# Patient Record
Sex: Female | Born: 1940
Health system: Southern US, Community
[De-identification: ages and names within clinical notes are randomized; demographics above are authoritative.]

## PROBLEM LIST (undated history)

## (undated) DIAGNOSIS — I10 Essential (primary) hypertension: Secondary | ICD-10-CM

## (undated) HISTORY — PX: APPENDECTOMY: SHX54

---

## 1999-03-02 ENCOUNTER — Other Ambulatory Visit: Admission: RE | Admit: 1999-03-02 | Discharge: 1999-03-02 | Payer: Self-pay | Admitting: Cardiology

## 2004-11-27 ENCOUNTER — Ambulatory Visit (HOSPITAL_COMMUNITY): Admission: RE | Admit: 2004-11-27 | Discharge: 2004-11-27 | Payer: Self-pay | Admitting: Gastroenterology

## 2012-01-09 DIAGNOSIS — H259 Unspecified age-related cataract: Secondary | ICD-10-CM | POA: Diagnosis not present

## 2012-01-09 DIAGNOSIS — Z961 Presence of intraocular lens: Secondary | ICD-10-CM | POA: Diagnosis not present

## 2012-01-15 DIAGNOSIS — L570 Actinic keratosis: Secondary | ICD-10-CM | POA: Diagnosis not present

## 2012-01-15 DIAGNOSIS — Z85828 Personal history of other malignant neoplasm of skin: Secondary | ICD-10-CM | POA: Diagnosis not present

## 2012-01-15 DIAGNOSIS — C4370 Malignant melanoma of unspecified lower limb, including hip: Secondary | ICD-10-CM | POA: Diagnosis not present

## 2012-01-15 DIAGNOSIS — L821 Other seborrheic keratosis: Secondary | ICD-10-CM | POA: Diagnosis not present

## 2012-01-16 DIAGNOSIS — H5310 Unspecified subjective visual disturbances: Secondary | ICD-10-CM | POA: Diagnosis not present

## 2012-01-29 DIAGNOSIS — C4370 Malignant melanoma of unspecified lower limb, including hip: Secondary | ICD-10-CM | POA: Diagnosis not present

## 2012-04-16 DIAGNOSIS — IMO0002 Reserved for concepts with insufficient information to code with codable children: Secondary | ICD-10-CM | POA: Diagnosis not present

## 2012-04-16 DIAGNOSIS — M76899 Other specified enthesopathies of unspecified lower limb, excluding foot: Secondary | ICD-10-CM | POA: Diagnosis not present

## 2012-04-16 DIAGNOSIS — M47817 Spondylosis without myelopathy or radiculopathy, lumbosacral region: Secondary | ICD-10-CM | POA: Diagnosis not present

## 2012-04-22 DIAGNOSIS — M76899 Other specified enthesopathies of unspecified lower limb, excluding foot: Secondary | ICD-10-CM | POA: Diagnosis not present

## 2012-04-22 DIAGNOSIS — M47817 Spondylosis without myelopathy or radiculopathy, lumbosacral region: Secondary | ICD-10-CM | POA: Diagnosis not present

## 2012-04-28 DIAGNOSIS — M76899 Other specified enthesopathies of unspecified lower limb, excluding foot: Secondary | ICD-10-CM | POA: Diagnosis not present

## 2012-04-28 DIAGNOSIS — M47817 Spondylosis without myelopathy or radiculopathy, lumbosacral region: Secondary | ICD-10-CM | POA: Diagnosis not present

## 2012-05-07 DIAGNOSIS — M76899 Other specified enthesopathies of unspecified lower limb, excluding foot: Secondary | ICD-10-CM | POA: Diagnosis not present

## 2012-05-07 DIAGNOSIS — M47817 Spondylosis without myelopathy or radiculopathy, lumbosacral region: Secondary | ICD-10-CM | POA: Diagnosis not present

## 2012-05-14 DIAGNOSIS — M76899 Other specified enthesopathies of unspecified lower limb, excluding foot: Secondary | ICD-10-CM | POA: Diagnosis not present

## 2012-05-14 DIAGNOSIS — M545 Low back pain, unspecified: Secondary | ICD-10-CM | POA: Diagnosis not present

## 2012-05-20 DIAGNOSIS — M76899 Other specified enthesopathies of unspecified lower limb, excluding foot: Secondary | ICD-10-CM | POA: Diagnosis not present

## 2012-05-20 DIAGNOSIS — M47817 Spondylosis without myelopathy or radiculopathy, lumbosacral region: Secondary | ICD-10-CM | POA: Diagnosis not present

## 2012-05-25 DIAGNOSIS — H43819 Vitreous degeneration, unspecified eye: Secondary | ICD-10-CM | POA: Diagnosis not present

## 2012-06-18 DIAGNOSIS — M47817 Spondylosis without myelopathy or radiculopathy, lumbosacral region: Secondary | ICD-10-CM | POA: Diagnosis not present

## 2012-06-18 DIAGNOSIS — M76899 Other specified enthesopathies of unspecified lower limb, excluding foot: Secondary | ICD-10-CM | POA: Diagnosis not present

## 2012-07-07 DIAGNOSIS — M47817 Spondylosis without myelopathy or radiculopathy, lumbosacral region: Secondary | ICD-10-CM | POA: Diagnosis not present

## 2012-07-07 DIAGNOSIS — M76899 Other specified enthesopathies of unspecified lower limb, excluding foot: Secondary | ICD-10-CM | POA: Diagnosis not present

## 2012-07-09 DIAGNOSIS — Z961 Presence of intraocular lens: Secondary | ICD-10-CM | POA: Diagnosis not present

## 2012-07-09 DIAGNOSIS — H43819 Vitreous degeneration, unspecified eye: Secondary | ICD-10-CM | POA: Diagnosis not present

## 2012-07-09 DIAGNOSIS — H259 Unspecified age-related cataract: Secondary | ICD-10-CM | POA: Diagnosis not present

## 2012-07-09 DIAGNOSIS — H01009 Unspecified blepharitis unspecified eye, unspecified eyelid: Secondary | ICD-10-CM | POA: Diagnosis not present

## 2012-07-15 DIAGNOSIS — Z85828 Personal history of other malignant neoplasm of skin: Secondary | ICD-10-CM | POA: Diagnosis not present

## 2012-07-15 DIAGNOSIS — L821 Other seborrheic keratosis: Secondary | ICD-10-CM | POA: Diagnosis not present

## 2012-07-15 DIAGNOSIS — C44319 Basal cell carcinoma of skin of other parts of face: Secondary | ICD-10-CM | POA: Diagnosis not present

## 2012-07-15 DIAGNOSIS — Z85820 Personal history of malignant melanoma of skin: Secondary | ICD-10-CM | POA: Diagnosis not present

## 2012-07-20 DIAGNOSIS — M76899 Other specified enthesopathies of unspecified lower limb, excluding foot: Secondary | ICD-10-CM | POA: Diagnosis not present

## 2012-07-20 DIAGNOSIS — M47817 Spondylosis without myelopathy or radiculopathy, lumbosacral region: Secondary | ICD-10-CM | POA: Diagnosis not present

## 2012-08-06 DIAGNOSIS — Z1231 Encounter for screening mammogram for malignant neoplasm of breast: Secondary | ICD-10-CM | POA: Diagnosis not present

## 2012-08-12 DIAGNOSIS — C44319 Basal cell carcinoma of skin of other parts of face: Secondary | ICD-10-CM | POA: Diagnosis not present

## 2012-10-06 DIAGNOSIS — I10 Essential (primary) hypertension: Secondary | ICD-10-CM | POA: Diagnosis not present

## 2012-10-06 DIAGNOSIS — M810 Age-related osteoporosis without current pathological fracture: Secondary | ICD-10-CM | POA: Diagnosis not present

## 2012-10-06 DIAGNOSIS — E785 Hyperlipidemia, unspecified: Secondary | ICD-10-CM | POA: Diagnosis not present

## 2012-10-06 DIAGNOSIS — R7301 Impaired fasting glucose: Secondary | ICD-10-CM | POA: Diagnosis not present

## 2012-10-06 DIAGNOSIS — R82998 Other abnormal findings in urine: Secondary | ICD-10-CM | POA: Diagnosis not present

## 2012-10-06 DIAGNOSIS — Z23 Encounter for immunization: Secondary | ICD-10-CM | POA: Diagnosis not present

## 2012-10-13 DIAGNOSIS — Z1331 Encounter for screening for depression: Secondary | ICD-10-CM | POA: Diagnosis not present

## 2012-10-13 DIAGNOSIS — Z124 Encounter for screening for malignant neoplasm of cervix: Secondary | ICD-10-CM | POA: Diagnosis not present

## 2012-10-16 DIAGNOSIS — E785 Hyperlipidemia, unspecified: Secondary | ICD-10-CM | POA: Diagnosis not present

## 2012-10-16 DIAGNOSIS — Z1331 Encounter for screening for depression: Secondary | ICD-10-CM | POA: Diagnosis not present

## 2012-10-16 DIAGNOSIS — R7301 Impaired fasting glucose: Secondary | ICD-10-CM | POA: Diagnosis not present

## 2012-10-16 DIAGNOSIS — Z0000 Encounter for general adult medical examination without abnormal findings: Secondary | ICD-10-CM | POA: Diagnosis not present

## 2012-10-16 DIAGNOSIS — I10 Essential (primary) hypertension: Secondary | ICD-10-CM | POA: Diagnosis not present

## 2012-10-16 DIAGNOSIS — Z124 Encounter for screening for malignant neoplasm of cervix: Secondary | ICD-10-CM | POA: Diagnosis not present

## 2012-10-19 DIAGNOSIS — Z1212 Encounter for screening for malignant neoplasm of rectum: Secondary | ICD-10-CM | POA: Diagnosis not present

## 2012-12-28 DIAGNOSIS — H259 Unspecified age-related cataract: Secondary | ICD-10-CM | POA: Diagnosis not present

## 2012-12-28 DIAGNOSIS — H01009 Unspecified blepharitis unspecified eye, unspecified eyelid: Secondary | ICD-10-CM | POA: Diagnosis not present

## 2012-12-28 DIAGNOSIS — H43819 Vitreous degeneration, unspecified eye: Secondary | ICD-10-CM | POA: Diagnosis not present

## 2012-12-28 DIAGNOSIS — Z961 Presence of intraocular lens: Secondary | ICD-10-CM | POA: Diagnosis not present

## 2013-01-13 DIAGNOSIS — Z85828 Personal history of other malignant neoplasm of skin: Secondary | ICD-10-CM | POA: Diagnosis not present

## 2013-01-13 DIAGNOSIS — Z85820 Personal history of malignant melanoma of skin: Secondary | ICD-10-CM | POA: Diagnosis not present

## 2013-01-13 DIAGNOSIS — L821 Other seborrheic keratosis: Secondary | ICD-10-CM | POA: Diagnosis not present

## 2013-02-16 DIAGNOSIS — H2510 Age-related nuclear cataract, unspecified eye: Secondary | ICD-10-CM | POA: Diagnosis not present

## 2013-02-16 DIAGNOSIS — H25049 Posterior subcapsular polar age-related cataract, unspecified eye: Secondary | ICD-10-CM | POA: Diagnosis not present

## 2013-02-16 DIAGNOSIS — H269 Unspecified cataract: Secondary | ICD-10-CM | POA: Diagnosis not present

## 2013-02-16 DIAGNOSIS — H25019 Cortical age-related cataract, unspecified eye: Secondary | ICD-10-CM | POA: Diagnosis not present

## 2013-07-14 DIAGNOSIS — Z85820 Personal history of malignant melanoma of skin: Secondary | ICD-10-CM | POA: Diagnosis not present

## 2013-07-14 DIAGNOSIS — Z85828 Personal history of other malignant neoplasm of skin: Secondary | ICD-10-CM | POA: Diagnosis not present

## 2013-07-14 DIAGNOSIS — L821 Other seborrheic keratosis: Secondary | ICD-10-CM | POA: Diagnosis not present

## 2013-07-14 DIAGNOSIS — L819 Disorder of pigmentation, unspecified: Secondary | ICD-10-CM | POA: Diagnosis not present

## 2013-07-14 DIAGNOSIS — D2370 Other benign neoplasm of skin of unspecified lower limb, including hip: Secondary | ICD-10-CM | POA: Diagnosis not present

## 2013-07-14 DIAGNOSIS — L820 Inflamed seborrheic keratosis: Secondary | ICD-10-CM | POA: Diagnosis not present

## 2013-09-24 DIAGNOSIS — Z1231 Encounter for screening mammogram for malignant neoplasm of breast: Secondary | ICD-10-CM | POA: Diagnosis not present

## 2013-10-07 DIAGNOSIS — Z23 Encounter for immunization: Secondary | ICD-10-CM | POA: Diagnosis not present

## 2013-11-02 DIAGNOSIS — Z85828 Personal history of other malignant neoplasm of skin: Secondary | ICD-10-CM | POA: Diagnosis not present

## 2013-11-02 DIAGNOSIS — D485 Neoplasm of uncertain behavior of skin: Secondary | ICD-10-CM | POA: Diagnosis not present

## 2013-11-02 DIAGNOSIS — L988 Other specified disorders of the skin and subcutaneous tissue: Secondary | ICD-10-CM | POA: Diagnosis not present

## 2013-11-03 DIAGNOSIS — M810 Age-related osteoporosis without current pathological fracture: Secondary | ICD-10-CM | POA: Diagnosis not present

## 2013-11-03 DIAGNOSIS — I10 Essential (primary) hypertension: Secondary | ICD-10-CM | POA: Diagnosis not present

## 2013-11-03 DIAGNOSIS — E785 Hyperlipidemia, unspecified: Secondary | ICD-10-CM | POA: Diagnosis not present

## 2013-11-03 DIAGNOSIS — R7301 Impaired fasting glucose: Secondary | ICD-10-CM | POA: Diagnosis not present

## 2013-11-10 DIAGNOSIS — Z0000 Encounter for general adult medical examination without abnormal findings: Secondary | ICD-10-CM | POA: Diagnosis not present

## 2013-11-10 DIAGNOSIS — IMO0001 Reserved for inherently not codable concepts without codable children: Secondary | ICD-10-CM | POA: Diagnosis not present

## 2013-11-10 DIAGNOSIS — I10 Essential (primary) hypertension: Secondary | ICD-10-CM | POA: Diagnosis not present

## 2013-11-10 DIAGNOSIS — Z23 Encounter for immunization: Secondary | ICD-10-CM | POA: Diagnosis not present

## 2013-11-10 DIAGNOSIS — Z124 Encounter for screening for malignant neoplasm of cervix: Secondary | ICD-10-CM | POA: Diagnosis not present

## 2013-11-10 DIAGNOSIS — Z1331 Encounter for screening for depression: Secondary | ICD-10-CM | POA: Diagnosis not present

## 2013-11-10 DIAGNOSIS — R011 Cardiac murmur, unspecified: Secondary | ICD-10-CM | POA: Diagnosis not present

## 2013-11-10 DIAGNOSIS — E785 Hyperlipidemia, unspecified: Secondary | ICD-10-CM | POA: Diagnosis not present

## 2013-11-10 DIAGNOSIS — Z79899 Other long term (current) drug therapy: Secondary | ICD-10-CM | POA: Diagnosis not present

## 2013-11-10 DIAGNOSIS — M129 Arthropathy, unspecified: Secondary | ICD-10-CM | POA: Diagnosis not present

## 2013-12-10 DIAGNOSIS — Z1212 Encounter for screening for malignant neoplasm of rectum: Secondary | ICD-10-CM | POA: Diagnosis not present

## 2014-01-18 DIAGNOSIS — D2370 Other benign neoplasm of skin of unspecified lower limb, including hip: Secondary | ICD-10-CM | POA: Diagnosis not present

## 2014-01-18 DIAGNOSIS — Z85828 Personal history of other malignant neoplasm of skin: Secondary | ICD-10-CM | POA: Diagnosis not present

## 2014-01-18 DIAGNOSIS — L821 Other seborrheic keratosis: Secondary | ICD-10-CM | POA: Diagnosis not present

## 2014-01-18 DIAGNOSIS — Z85820 Personal history of malignant melanoma of skin: Secondary | ICD-10-CM | POA: Diagnosis not present

## 2014-04-14 DIAGNOSIS — Z961 Presence of intraocular lens: Secondary | ICD-10-CM | POA: Diagnosis not present

## 2014-04-14 DIAGNOSIS — H01009 Unspecified blepharitis unspecified eye, unspecified eyelid: Secondary | ICD-10-CM | POA: Diagnosis not present

## 2014-05-20 DIAGNOSIS — I10 Essential (primary) hypertension: Secondary | ICD-10-CM | POA: Diagnosis not present

## 2014-05-20 DIAGNOSIS — IMO0002 Reserved for concepts with insufficient information to code with codable children: Secondary | ICD-10-CM | POA: Diagnosis not present

## 2014-05-20 DIAGNOSIS — L259 Unspecified contact dermatitis, unspecified cause: Secondary | ICD-10-CM | POA: Diagnosis not present

## 2014-05-20 DIAGNOSIS — K589 Irritable bowel syndrome without diarrhea: Secondary | ICD-10-CM | POA: Diagnosis not present

## 2014-07-11 DIAGNOSIS — L821 Other seborrheic keratosis: Secondary | ICD-10-CM | POA: Diagnosis not present

## 2014-07-11 DIAGNOSIS — Z85820 Personal history of malignant melanoma of skin: Secondary | ICD-10-CM | POA: Diagnosis not present

## 2014-07-11 DIAGNOSIS — Z85828 Personal history of other malignant neoplasm of skin: Secondary | ICD-10-CM | POA: Diagnosis not present

## 2014-07-22 DIAGNOSIS — K6289 Other specified diseases of anus and rectum: Secondary | ICD-10-CM | POA: Diagnosis not present

## 2014-07-22 DIAGNOSIS — Z1211 Encounter for screening for malignant neoplasm of colon: Secondary | ICD-10-CM | POA: Diagnosis not present

## 2014-09-26 DIAGNOSIS — Z23 Encounter for immunization: Secondary | ICD-10-CM | POA: Diagnosis not present

## 2014-12-05 DIAGNOSIS — Z1211 Encounter for screening for malignant neoplasm of colon: Secondary | ICD-10-CM | POA: Diagnosis not present

## 2014-12-05 DIAGNOSIS — K5730 Diverticulosis of large intestine without perforation or abscess without bleeding: Secondary | ICD-10-CM | POA: Diagnosis not present

## 2014-12-14 DIAGNOSIS — M810 Age-related osteoporosis without current pathological fracture: Secondary | ICD-10-CM | POA: Diagnosis not present

## 2014-12-14 DIAGNOSIS — I10 Essential (primary) hypertension: Secondary | ICD-10-CM | POA: Diagnosis not present

## 2014-12-14 DIAGNOSIS — R7301 Impaired fasting glucose: Secondary | ICD-10-CM | POA: Diagnosis not present

## 2014-12-14 DIAGNOSIS — Z008 Encounter for other general examination: Secondary | ICD-10-CM | POA: Diagnosis not present

## 2014-12-21 DIAGNOSIS — K6289 Other specified diseases of anus and rectum: Secondary | ICD-10-CM | POA: Diagnosis not present

## 2014-12-21 DIAGNOSIS — I10 Essential (primary) hypertension: Secondary | ICD-10-CM | POA: Diagnosis not present

## 2014-12-21 DIAGNOSIS — J309 Allergic rhinitis, unspecified: Secondary | ICD-10-CM | POA: Diagnosis not present

## 2014-12-21 DIAGNOSIS — M1990 Unspecified osteoarthritis, unspecified site: Secondary | ICD-10-CM | POA: Diagnosis not present

## 2014-12-21 DIAGNOSIS — K589 Irritable bowel syndrome without diarrhea: Secondary | ICD-10-CM | POA: Diagnosis not present

## 2014-12-21 DIAGNOSIS — Z1389 Encounter for screening for other disorder: Secondary | ICD-10-CM | POA: Diagnosis not present

## 2014-12-21 DIAGNOSIS — R011 Cardiac murmur, unspecified: Secondary | ICD-10-CM | POA: Diagnosis not present

## 2014-12-21 DIAGNOSIS — E785 Hyperlipidemia, unspecified: Secondary | ICD-10-CM | POA: Diagnosis not present

## 2014-12-21 DIAGNOSIS — Z0000 Encounter for general adult medical examination without abnormal findings: Secondary | ICD-10-CM | POA: Diagnosis not present

## 2015-01-10 DIAGNOSIS — Z85820 Personal history of malignant melanoma of skin: Secondary | ICD-10-CM | POA: Diagnosis not present

## 2015-01-10 DIAGNOSIS — Z85828 Personal history of other malignant neoplasm of skin: Secondary | ICD-10-CM | POA: Diagnosis not present

## 2015-01-10 DIAGNOSIS — L820 Inflamed seborrheic keratosis: Secondary | ICD-10-CM | POA: Diagnosis not present

## 2015-01-10 DIAGNOSIS — L821 Other seborrheic keratosis: Secondary | ICD-10-CM | POA: Diagnosis not present

## 2015-01-10 DIAGNOSIS — L570 Actinic keratosis: Secondary | ICD-10-CM | POA: Diagnosis not present

## 2015-01-10 DIAGNOSIS — D2361 Other benign neoplasm of skin of right upper limb, including shoulder: Secondary | ICD-10-CM | POA: Diagnosis not present

## 2015-01-10 DIAGNOSIS — D485 Neoplasm of uncertain behavior of skin: Secondary | ICD-10-CM | POA: Diagnosis not present

## 2015-01-18 DIAGNOSIS — Z1231 Encounter for screening mammogram for malignant neoplasm of breast: Secondary | ICD-10-CM | POA: Diagnosis not present

## 2015-01-23 DIAGNOSIS — H4312 Vitreous hemorrhage, left eye: Secondary | ICD-10-CM | POA: Diagnosis not present

## 2015-01-23 DIAGNOSIS — H5310 Unspecified subjective visual disturbances: Secondary | ICD-10-CM | POA: Diagnosis not present

## 2015-01-23 DIAGNOSIS — Z961 Presence of intraocular lens: Secondary | ICD-10-CM | POA: Diagnosis not present

## 2015-01-23 DIAGNOSIS — H43812 Vitreous degeneration, left eye: Secondary | ICD-10-CM | POA: Diagnosis not present

## 2015-03-03 DIAGNOSIS — H01004 Unspecified blepharitis left upper eyelid: Secondary | ICD-10-CM | POA: Diagnosis not present

## 2015-03-03 DIAGNOSIS — Z961 Presence of intraocular lens: Secondary | ICD-10-CM | POA: Diagnosis not present

## 2015-03-03 DIAGNOSIS — H43812 Vitreous degeneration, left eye: Secondary | ICD-10-CM | POA: Diagnosis not present

## 2015-03-03 DIAGNOSIS — H01001 Unspecified blepharitis right upper eyelid: Secondary | ICD-10-CM | POA: Diagnosis not present

## 2015-04-17 DIAGNOSIS — H01001 Unspecified blepharitis right upper eyelid: Secondary | ICD-10-CM | POA: Diagnosis not present

## 2015-04-17 DIAGNOSIS — H4312 Vitreous hemorrhage, left eye: Secondary | ICD-10-CM | POA: Diagnosis not present

## 2015-04-17 DIAGNOSIS — H01004 Unspecified blepharitis left upper eyelid: Secondary | ICD-10-CM | POA: Diagnosis not present

## 2015-04-17 DIAGNOSIS — Z961 Presence of intraocular lens: Secondary | ICD-10-CM | POA: Diagnosis not present

## 2015-07-11 DIAGNOSIS — D2361 Other benign neoplasm of skin of right upper limb, including shoulder: Secondary | ICD-10-CM | POA: Diagnosis not present

## 2015-07-11 DIAGNOSIS — D1801 Hemangioma of skin and subcutaneous tissue: Secondary | ICD-10-CM | POA: Diagnosis not present

## 2015-07-11 DIAGNOSIS — L821 Other seborrheic keratosis: Secondary | ICD-10-CM | POA: Diagnosis not present

## 2015-07-11 DIAGNOSIS — Z85828 Personal history of other malignant neoplasm of skin: Secondary | ICD-10-CM | POA: Diagnosis not present

## 2015-07-11 DIAGNOSIS — Z85820 Personal history of malignant melanoma of skin: Secondary | ICD-10-CM | POA: Diagnosis not present

## 2015-07-11 DIAGNOSIS — D2372 Other benign neoplasm of skin of left lower limb, including hip: Secondary | ICD-10-CM | POA: Diagnosis not present

## 2015-10-02 DIAGNOSIS — Z23 Encounter for immunization: Secondary | ICD-10-CM | POA: Diagnosis not present

## 2016-01-11 DIAGNOSIS — D1801 Hemangioma of skin and subcutaneous tissue: Secondary | ICD-10-CM | POA: Diagnosis not present

## 2016-01-11 DIAGNOSIS — D2372 Other benign neoplasm of skin of left lower limb, including hip: Secondary | ICD-10-CM | POA: Diagnosis not present

## 2016-01-11 DIAGNOSIS — Z85828 Personal history of other malignant neoplasm of skin: Secondary | ICD-10-CM | POA: Diagnosis not present

## 2016-01-11 DIAGNOSIS — L821 Other seborrheic keratosis: Secondary | ICD-10-CM | POA: Diagnosis not present

## 2016-01-24 DIAGNOSIS — N390 Urinary tract infection, site not specified: Secondary | ICD-10-CM | POA: Diagnosis not present

## 2016-01-24 DIAGNOSIS — E784 Other hyperlipidemia: Secondary | ICD-10-CM | POA: Diagnosis not present

## 2016-01-24 DIAGNOSIS — R8299 Other abnormal findings in urine: Secondary | ICD-10-CM | POA: Diagnosis not present

## 2016-01-24 DIAGNOSIS — M810 Age-related osteoporosis without current pathological fracture: Secondary | ICD-10-CM | POA: Diagnosis not present

## 2016-01-24 DIAGNOSIS — R7301 Impaired fasting glucose: Secondary | ICD-10-CM | POA: Diagnosis not present

## 2016-01-24 DIAGNOSIS — I10 Essential (primary) hypertension: Secondary | ICD-10-CM | POA: Diagnosis not present

## 2016-01-31 DIAGNOSIS — Z6820 Body mass index (BMI) 20.0-20.9, adult: Secondary | ICD-10-CM | POA: Diagnosis not present

## 2016-01-31 DIAGNOSIS — I10 Essential (primary) hypertension: Secondary | ICD-10-CM | POA: Diagnosis not present

## 2016-01-31 DIAGNOSIS — K589 Irritable bowel syndrome without diarrhea: Secondary | ICD-10-CM | POA: Diagnosis not present

## 2016-01-31 DIAGNOSIS — Z1389 Encounter for screening for other disorder: Secondary | ICD-10-CM | POA: Diagnosis not present

## 2016-01-31 DIAGNOSIS — M810 Age-related osteoporosis without current pathological fracture: Secondary | ICD-10-CM | POA: Diagnosis not present

## 2016-01-31 DIAGNOSIS — R7301 Impaired fasting glucose: Secondary | ICD-10-CM | POA: Diagnosis not present

## 2016-01-31 DIAGNOSIS — M1990 Unspecified osteoarthritis, unspecified site: Secondary | ICD-10-CM | POA: Diagnosis not present

## 2016-01-31 DIAGNOSIS — M797 Fibromyalgia: Secondary | ICD-10-CM | POA: Diagnosis not present

## 2016-01-31 DIAGNOSIS — Z0000 Encounter for general adult medical examination without abnormal findings: Secondary | ICD-10-CM | POA: Diagnosis not present

## 2016-01-31 DIAGNOSIS — Z1231 Encounter for screening mammogram for malignant neoplasm of breast: Secondary | ICD-10-CM | POA: Diagnosis not present

## 2016-01-31 DIAGNOSIS — K6289 Other specified diseases of anus and rectum: Secondary | ICD-10-CM | POA: Diagnosis not present

## 2016-01-31 DIAGNOSIS — Z1212 Encounter for screening for malignant neoplasm of rectum: Secondary | ICD-10-CM | POA: Diagnosis not present

## 2016-01-31 DIAGNOSIS — E784 Other hyperlipidemia: Secondary | ICD-10-CM | POA: Diagnosis not present

## 2016-01-31 DIAGNOSIS — R011 Cardiac murmur, unspecified: Secondary | ICD-10-CM | POA: Diagnosis not present

## 2016-04-24 DIAGNOSIS — Z961 Presence of intraocular lens: Secondary | ICD-10-CM | POA: Diagnosis not present

## 2016-04-24 DIAGNOSIS — H01001 Unspecified blepharitis right upper eyelid: Secondary | ICD-10-CM | POA: Diagnosis not present

## 2016-04-24 DIAGNOSIS — H01004 Unspecified blepharitis left upper eyelid: Secondary | ICD-10-CM | POA: Diagnosis not present

## 2016-04-24 DIAGNOSIS — Z0100 Encounter for examination of eyes and vision without abnormal findings: Secondary | ICD-10-CM | POA: Diagnosis not present

## 2016-05-06 DIAGNOSIS — Z1231 Encounter for screening mammogram for malignant neoplasm of breast: Secondary | ICD-10-CM | POA: Diagnosis not present

## 2016-07-22 DIAGNOSIS — Z85820 Personal history of malignant melanoma of skin: Secondary | ICD-10-CM | POA: Diagnosis not present

## 2016-07-22 DIAGNOSIS — D1801 Hemangioma of skin and subcutaneous tissue: Secondary | ICD-10-CM | POA: Diagnosis not present

## 2016-07-22 DIAGNOSIS — Z85828 Personal history of other malignant neoplasm of skin: Secondary | ICD-10-CM | POA: Diagnosis not present

## 2016-07-22 DIAGNOSIS — D2372 Other benign neoplasm of skin of left lower limb, including hip: Secondary | ICD-10-CM | POA: Diagnosis not present

## 2016-07-22 DIAGNOSIS — L821 Other seborrheic keratosis: Secondary | ICD-10-CM | POA: Diagnosis not present

## 2016-07-22 DIAGNOSIS — D225 Melanocytic nevi of trunk: Secondary | ICD-10-CM | POA: Diagnosis not present

## 2016-07-22 DIAGNOSIS — L570 Actinic keratosis: Secondary | ICD-10-CM | POA: Diagnosis not present

## 2016-09-24 DIAGNOSIS — Z23 Encounter for immunization: Secondary | ICD-10-CM | POA: Diagnosis not present

## 2017-01-27 DIAGNOSIS — Z85828 Personal history of other malignant neoplasm of skin: Secondary | ICD-10-CM | POA: Diagnosis not present

## 2017-01-27 DIAGNOSIS — D2271 Melanocytic nevi of right lower limb, including hip: Secondary | ICD-10-CM | POA: Diagnosis not present

## 2017-01-27 DIAGNOSIS — Z85820 Personal history of malignant melanoma of skin: Secondary | ICD-10-CM | POA: Diagnosis not present

## 2017-01-27 DIAGNOSIS — L570 Actinic keratosis: Secondary | ICD-10-CM | POA: Diagnosis not present

## 2017-01-27 DIAGNOSIS — D1801 Hemangioma of skin and subcutaneous tissue: Secondary | ICD-10-CM | POA: Diagnosis not present

## 2017-01-27 DIAGNOSIS — D2361 Other benign neoplasm of skin of right upper limb, including shoulder: Secondary | ICD-10-CM | POA: Diagnosis not present

## 2017-01-27 DIAGNOSIS — L218 Other seborrheic dermatitis: Secondary | ICD-10-CM | POA: Diagnosis not present

## 2017-01-27 DIAGNOSIS — L821 Other seborrheic keratosis: Secondary | ICD-10-CM | POA: Diagnosis not present

## 2017-02-05 DIAGNOSIS — K648 Other hemorrhoids: Secondary | ICD-10-CM | POA: Diagnosis not present

## 2017-02-07 DIAGNOSIS — R7301 Impaired fasting glucose: Secondary | ICD-10-CM | POA: Diagnosis not present

## 2017-02-07 DIAGNOSIS — M810 Age-related osteoporosis without current pathological fracture: Secondary | ICD-10-CM | POA: Diagnosis not present

## 2017-02-07 DIAGNOSIS — E784 Other hyperlipidemia: Secondary | ICD-10-CM | POA: Diagnosis not present

## 2017-02-07 DIAGNOSIS — I10 Essential (primary) hypertension: Secondary | ICD-10-CM | POA: Diagnosis not present

## 2017-02-07 DIAGNOSIS — Z0000 Encounter for general adult medical examination without abnormal findings: Secondary | ICD-10-CM | POA: Diagnosis not present

## 2017-02-14 DIAGNOSIS — Z0000 Encounter for general adult medical examination without abnormal findings: Secondary | ICD-10-CM | POA: Diagnosis not present

## 2017-02-14 DIAGNOSIS — M797 Fibromyalgia: Secondary | ICD-10-CM | POA: Diagnosis not present

## 2017-02-14 DIAGNOSIS — K6289 Other specified diseases of anus and rectum: Secondary | ICD-10-CM | POA: Diagnosis not present

## 2017-02-14 DIAGNOSIS — Z6820 Body mass index (BMI) 20.0-20.9, adult: Secondary | ICD-10-CM | POA: Diagnosis not present

## 2017-02-14 DIAGNOSIS — M1990 Unspecified osteoarthritis, unspecified site: Secondary | ICD-10-CM | POA: Diagnosis not present

## 2017-02-14 DIAGNOSIS — N6489 Other specified disorders of breast: Secondary | ICD-10-CM | POA: Diagnosis not present

## 2017-02-14 DIAGNOSIS — Z1231 Encounter for screening mammogram for malignant neoplasm of breast: Secondary | ICD-10-CM | POA: Diagnosis not present

## 2017-02-14 DIAGNOSIS — M810 Age-related osteoporosis without current pathological fracture: Secondary | ICD-10-CM | POA: Diagnosis not present

## 2017-02-14 DIAGNOSIS — Z1389 Encounter for screening for other disorder: Secondary | ICD-10-CM | POA: Diagnosis not present

## 2017-02-14 DIAGNOSIS — M79604 Pain in right leg: Secondary | ICD-10-CM | POA: Diagnosis not present

## 2017-02-14 DIAGNOSIS — C4491 Basal cell carcinoma of skin, unspecified: Secondary | ICD-10-CM | POA: Diagnosis not present

## 2017-02-14 DIAGNOSIS — K649 Unspecified hemorrhoids: Secondary | ICD-10-CM | POA: Diagnosis not present

## 2017-02-17 DIAGNOSIS — Z23 Encounter for immunization: Secondary | ICD-10-CM | POA: Diagnosis not present

## 2017-02-19 DIAGNOSIS — K648 Other hemorrhoids: Secondary | ICD-10-CM | POA: Diagnosis not present

## 2017-02-25 DIAGNOSIS — N6323 Unspecified lump in the left breast, lower outer quadrant: Secondary | ICD-10-CM | POA: Diagnosis not present

## 2017-03-04 DIAGNOSIS — Z6821 Body mass index (BMI) 21.0-21.9, adult: Secondary | ICD-10-CM | POA: Diagnosis not present

## 2017-03-04 DIAGNOSIS — M5489 Other dorsalgia: Secondary | ICD-10-CM | POA: Diagnosis not present

## 2017-03-19 DIAGNOSIS — K648 Other hemorrhoids: Secondary | ICD-10-CM | POA: Diagnosis not present

## 2017-04-25 DIAGNOSIS — H01001 Unspecified blepharitis right upper eyelid: Secondary | ICD-10-CM | POA: Diagnosis not present

## 2017-04-25 DIAGNOSIS — H524 Presbyopia: Secondary | ICD-10-CM | POA: Diagnosis not present

## 2017-04-25 DIAGNOSIS — Z961 Presence of intraocular lens: Secondary | ICD-10-CM | POA: Diagnosis not present

## 2017-04-25 DIAGNOSIS — H26491 Other secondary cataract, right eye: Secondary | ICD-10-CM | POA: Diagnosis not present

## 2017-08-12 DIAGNOSIS — L821 Other seborrheic keratosis: Secondary | ICD-10-CM | POA: Diagnosis not present

## 2017-08-12 DIAGNOSIS — Z85828 Personal history of other malignant neoplasm of skin: Secondary | ICD-10-CM | POA: Diagnosis not present

## 2017-08-12 DIAGNOSIS — D2272 Melanocytic nevi of left lower limb, including hip: Secondary | ICD-10-CM | POA: Diagnosis not present

## 2017-08-12 DIAGNOSIS — Z85820 Personal history of malignant melanoma of skin: Secondary | ICD-10-CM | POA: Diagnosis not present

## 2017-08-12 DIAGNOSIS — D1801 Hemangioma of skin and subcutaneous tissue: Secondary | ICD-10-CM | POA: Diagnosis not present

## 2017-08-12 DIAGNOSIS — D2271 Melanocytic nevi of right lower limb, including hip: Secondary | ICD-10-CM | POA: Diagnosis not present

## 2017-10-04 DIAGNOSIS — Z23 Encounter for immunization: Secondary | ICD-10-CM | POA: Diagnosis not present

## 2018-02-12 DIAGNOSIS — L820 Inflamed seborrheic keratosis: Secondary | ICD-10-CM | POA: Diagnosis not present

## 2018-02-12 DIAGNOSIS — L821 Other seborrheic keratosis: Secondary | ICD-10-CM | POA: Diagnosis not present

## 2018-02-12 DIAGNOSIS — Z85820 Personal history of malignant melanoma of skin: Secondary | ICD-10-CM | POA: Diagnosis not present

## 2018-02-12 DIAGNOSIS — C4441 Basal cell carcinoma of skin of scalp and neck: Secondary | ICD-10-CM | POA: Diagnosis not present

## 2018-02-12 DIAGNOSIS — D2361 Other benign neoplasm of skin of right upper limb, including shoulder: Secondary | ICD-10-CM | POA: Diagnosis not present

## 2018-02-12 DIAGNOSIS — D1801 Hemangioma of skin and subcutaneous tissue: Secondary | ICD-10-CM | POA: Diagnosis not present

## 2018-02-12 DIAGNOSIS — Z85828 Personal history of other malignant neoplasm of skin: Secondary | ICD-10-CM | POA: Diagnosis not present

## 2018-02-12 DIAGNOSIS — D485 Neoplasm of uncertain behavior of skin: Secondary | ICD-10-CM | POA: Diagnosis not present

## 2018-03-17 DIAGNOSIS — Z85828 Personal history of other malignant neoplasm of skin: Secondary | ICD-10-CM | POA: Diagnosis not present

## 2018-03-17 DIAGNOSIS — C4441 Basal cell carcinoma of skin of scalp and neck: Secondary | ICD-10-CM | POA: Diagnosis not present

## 2018-03-24 DIAGNOSIS — M810 Age-related osteoporosis without current pathological fracture: Secondary | ICD-10-CM | POA: Diagnosis not present

## 2018-03-24 DIAGNOSIS — R82998 Other abnormal findings in urine: Secondary | ICD-10-CM | POA: Diagnosis not present

## 2018-03-24 DIAGNOSIS — R7301 Impaired fasting glucose: Secondary | ICD-10-CM | POA: Diagnosis not present

## 2018-03-24 DIAGNOSIS — E7849 Other hyperlipidemia: Secondary | ICD-10-CM | POA: Diagnosis not present

## 2018-03-24 DIAGNOSIS — I10 Essential (primary) hypertension: Secondary | ICD-10-CM | POA: Diagnosis not present

## 2018-03-31 DIAGNOSIS — M1990 Unspecified osteoarthritis, unspecified site: Secondary | ICD-10-CM | POA: Diagnosis not present

## 2018-03-31 DIAGNOSIS — Z1389 Encounter for screening for other disorder: Secondary | ICD-10-CM | POA: Diagnosis not present

## 2018-03-31 DIAGNOSIS — M549 Dorsalgia, unspecified: Secondary | ICD-10-CM | POA: Diagnosis not present

## 2018-03-31 DIAGNOSIS — Z9289 Personal history of other medical treatment: Secondary | ICD-10-CM | POA: Diagnosis not present

## 2018-03-31 DIAGNOSIS — K649 Unspecified hemorrhoids: Secondary | ICD-10-CM | POA: Diagnosis not present

## 2018-03-31 DIAGNOSIS — Z0000 Encounter for general adult medical examination without abnormal findings: Secondary | ICD-10-CM | POA: Diagnosis not present

## 2018-03-31 DIAGNOSIS — Z6821 Body mass index (BMI) 21.0-21.9, adult: Secondary | ICD-10-CM | POA: Diagnosis not present

## 2018-03-31 DIAGNOSIS — L309 Dermatitis, unspecified: Secondary | ICD-10-CM | POA: Diagnosis not present

## 2018-03-31 DIAGNOSIS — M797 Fibromyalgia: Secondary | ICD-10-CM | POA: Diagnosis not present

## 2018-03-31 DIAGNOSIS — K6289 Other specified diseases of anus and rectum: Secondary | ICD-10-CM | POA: Diagnosis not present

## 2018-03-31 DIAGNOSIS — M810 Age-related osteoporosis without current pathological fracture: Secondary | ICD-10-CM | POA: Diagnosis not present

## 2018-03-31 DIAGNOSIS — M79604 Pain in right leg: Secondary | ICD-10-CM | POA: Diagnosis not present

## 2018-04-06 DIAGNOSIS — Z1212 Encounter for screening for malignant neoplasm of rectum: Secondary | ICD-10-CM | POA: Diagnosis not present

## 2018-04-09 ENCOUNTER — Encounter (HOSPITAL_COMMUNITY): Payer: Self-pay

## 2018-04-09 DIAGNOSIS — I10 Essential (primary) hypertension: Secondary | ICD-10-CM | POA: Diagnosis not present

## 2018-04-09 DIAGNOSIS — R82998 Other abnormal findings in urine: Secondary | ICD-10-CM | POA: Diagnosis not present

## 2018-04-27 DIAGNOSIS — Z961 Presence of intraocular lens: Secondary | ICD-10-CM | POA: Diagnosis not present

## 2018-04-27 DIAGNOSIS — H04123 Dry eye syndrome of bilateral lacrimal glands: Secondary | ICD-10-CM | POA: Diagnosis not present

## 2018-04-27 DIAGNOSIS — H26491 Other secondary cataract, right eye: Secondary | ICD-10-CM | POA: Diagnosis not present

## 2018-04-27 DIAGNOSIS — H524 Presbyopia: Secondary | ICD-10-CM | POA: Diagnosis not present

## 2018-05-22 ENCOUNTER — Encounter (HOSPITAL_COMMUNITY): Payer: Self-pay

## 2018-06-15 DIAGNOSIS — Z1231 Encounter for screening mammogram for malignant neoplasm of breast: Secondary | ICD-10-CM | POA: Diagnosis not present

## 2018-06-29 ENCOUNTER — Emergency Department (HOSPITAL_COMMUNITY)
Admission: EM | Admit: 2018-06-29 | Discharge: 2018-06-30 | Disposition: A | Discharge status: Home / Self Care | Payer: Medicare Other | Attending: Emergency Medicine | Admitting: Emergency Medicine

## 2018-06-29 ENCOUNTER — Emergency Department (HOSPITAL_COMMUNITY): Payer: Medicare Other

## 2018-06-29 ENCOUNTER — Encounter (HOSPITAL_COMMUNITY): Payer: Self-pay | Admitting: *Deleted

## 2018-06-29 ENCOUNTER — Other Ambulatory Visit: Payer: Self-pay

## 2018-06-29 DIAGNOSIS — I10 Essential (primary) hypertension: Secondary | ICD-10-CM | POA: Diagnosis not present

## 2018-06-29 DIAGNOSIS — Y929 Unspecified place or not applicable: Secondary | ICD-10-CM | POA: Insufficient documentation

## 2018-06-29 DIAGNOSIS — S0003XA Contusion of scalp, initial encounter: Secondary | ICD-10-CM | POA: Insufficient documentation

## 2018-06-29 DIAGNOSIS — Y999 Unspecified external cause status: Secondary | ICD-10-CM | POA: Diagnosis not present

## 2018-06-29 DIAGNOSIS — G939 Disorder of brain, unspecified: Secondary | ICD-10-CM | POA: Insufficient documentation

## 2018-06-29 DIAGNOSIS — M25551 Pain in right hip: Secondary | ICD-10-CM | POA: Diagnosis not present

## 2018-06-29 DIAGNOSIS — Y9389 Activity, other specified: Secondary | ICD-10-CM | POA: Diagnosis not present

## 2018-06-29 DIAGNOSIS — I959 Hypotension, unspecified: Secondary | ICD-10-CM | POA: Diagnosis not present

## 2018-06-29 DIAGNOSIS — G930 Cerebral cysts: Secondary | ICD-10-CM | POA: Diagnosis not present

## 2018-06-29 DIAGNOSIS — W108XXA Fall (on) (from) other stairs and steps, initial encounter: Secondary | ICD-10-CM | POA: Diagnosis not present

## 2018-06-29 DIAGNOSIS — S79911A Unspecified injury of right hip, initial encounter: Secondary | ICD-10-CM | POA: Diagnosis not present

## 2018-06-29 DIAGNOSIS — R51 Headache: Secondary | ICD-10-CM | POA: Diagnosis not present

## 2018-06-29 DIAGNOSIS — G9389 Other specified disorders of brain: Secondary | ICD-10-CM

## 2018-06-29 DIAGNOSIS — R52 Pain, unspecified: Secondary | ICD-10-CM | POA: Diagnosis not present

## 2018-06-29 DIAGNOSIS — W19XXXA Unspecified fall, initial encounter: Secondary | ICD-10-CM | POA: Diagnosis not present

## 2018-06-29 DIAGNOSIS — S0990XA Unspecified injury of head, initial encounter: Secondary | ICD-10-CM | POA: Diagnosis not present

## 2018-06-29 HISTORY — DX: Essential (primary) hypertension: I10

## 2018-06-29 LAB — I-STAT CHEM 8, ED
BUN: 19 mg/dL (ref 8–23)
CHLORIDE: 93 mmol/L — AB (ref 98–111)
Calcium, Ion: 1.15 mmol/L (ref 1.15–1.40)
Creatinine, Ser: 0.7 mg/dL (ref 0.44–1.00)
Glucose, Bld: 119 mg/dL — ABNORMAL HIGH (ref 70–99)
HCT: 42 % (ref 36.0–46.0)
Hemoglobin: 14.3 g/dL (ref 12.0–15.0)
POTASSIUM: 3.7 mmol/L (ref 3.5–5.1)
SODIUM: 131 mmol/L — AB (ref 135–145)
TCO2: 25 mmol/L (ref 22–32)

## 2018-06-29 NOTE — ED Notes (Signed)
[facility] picking up patient to take to [facility]. 

## 2018-06-29 NOTE — ED Triage Notes (Signed)
Per EMS, pt fell back while walking up 2 steps, falling onto concrete. Pt complains of pain in the back of her head and right buttocks. Pt denies loss of consciousness, neck or back pain. Pt is not on blood thinners.

## 2018-06-29 NOTE — ED Notes (Signed)
Bed: WA23 Expected date:  Expected time:  Means of arrival:  Comments: EMS-fall 

## 2018-06-29 NOTE — ED Provider Notes (Signed)
[facility] DEPT Provider Note   CSN: [id number] Arrival date & time: 06/29/18  1557     History   Chief Complaint Chief Complaint  Patient presents with  . Fall    HPI MELISS FLEEK is a 77 y.o. female.  HPI Patient is in her normal state of health prior to fall today.  States she was walking up steps and missed a step causing her to fall backward onto her right hip and strike her head on concrete.  States she fell down 2 steps.  Denies losing consciousness.  Complains of pain to the scalp and right buttock.  Has been ambulatory since the fall.  Denies any focal weakness or numbness.  No neck pain.  No nausea or vomiting.  No palpitations, chest pain or shortness of breath.  Denies any urinary symptoms. Past Medical History:  Diagnosis Date  . Hypertension     There are no active problems to display for this patient.   Past Surgical History:  Procedure Laterality Date  . APPENDECTOMY       OB History   None      Home Medications    Prior to Admission medications   Medication Sig Start Date End Date Taking? Authorizing Provider  cholecalciferol (VITAMIN D) 1000 units tablet Take 1,000 Units by mouth daily.   Yes [provider]  Multiple Vitamins-Minerals (MULTIVITAMIN ADULT) TABS Take 1 tablet by mouth daily.   Yes [provider]  olmesartan-hydrochlorothiazide (BENICAR HCT) 20-12.5 MG tablet Take 1 tablet by mouth daily.   Yes [provider]  Omega-3 Fatty Acids (FISH OIL) 1000 MG CAPS Take 1 capsule by mouth daily.   Yes [provider]    Family History No family history on file.  Social History Social History   Tobacco Use  . Smoking status: Not on file  Substance Use Topics  . Alcohol use: Not on file  . Drug use: Not on file     Allergies   Doxycycline hyclate and Omeprazole   Review of Systems Review of Systems  Constitutional: Negative for chills and fever.  HENT:  Negative for congestion, sore throat, trouble swallowing and voice change.   Eyes: Negative for visual disturbance.  Respiratory: Negative for cough and shortness of breath.   Cardiovascular: Negative for chest pain.  Gastrointestinal: Negative for abdominal pain, constipation, diarrhea, nausea and vomiting.  Genitourinary: Negative for dysuria, flank pain and frequency.  Musculoskeletal: Positive for arthralgias. Negative for back pain, myalgias and neck pain.  Skin: Negative for rash and wound.  Neurological: Positive for headaches. Negative for dizziness, syncope, weakness, light-headedness and numbness.  All other systems reviewed and are negative.    Physical Exam Updated Vital Signs BP (!) 176/80 (BP Location: Left Arm)   Pulse 91   Temp 98.3 F (36.8 C) (Oral)   Resp 18   Ht 5\' 6"  (1.676 m)   Wt 56.2 kg (124 lb)   SpO2 100%   BMI 20.01 kg/m   Physical Exam  Constitutional: She is oriented to person, place, and time. She appears well-developed and well-nourished. No distress.  HENT:  Head: Normocephalic and atraumatic.  Mouth/Throat: Oropharynx is clear and moist.  Small contusion noted to the right parietal scalp.  No laceration.  Midface is stable.  Eyes: Pupils are equal, round, and reactive to light. EOM are normal.  Neck: Normal range of motion. Neck supple.  No posterior midline cervical tenderness to palpation.  Cardiovascular: Normal rate and  regular rhythm. Exam reveals no gallop and no friction rub.  No murmur heard. Pulmonary/Chest: Effort normal and breath sounds normal. No stridor. No respiratory distress. She has no wheezes. She has no rales. She exhibits no tenderness.  Abdominal: Soft. Bowel sounds are normal. There is no tenderness. There is no rebound and no guarding.  Musculoskeletal: Normal range of motion. She exhibits no edema or tenderness.  No midline thoracic or lumbar tenderness.  Patient does have some tenderness to palpation over the right  buttock.  Mild tenderness with range of motion of the right hip.  Distal pulses are 2+.  Neurological: She is alert and oriented to person, place, and time.  5/5 motor in all extremities.  Sensation fully intact.  Skin: Skin is warm and dry. Capillary refill takes less than 2 seconds. No rash noted. She is not diaphoretic. No erythema.  Psychiatric: She has a normal mood and affect. Her behavior is normal.  Nursing note and vitals reviewed.    ED Treatments / Results  Labs (all labs ordered are listed, but only abnormal results are displayed) Labs Reviewed  I-STAT CHEM 8, ED - Abnormal; Notable for the following components:      Result Value   Sodium 131 (*)    Chloride 93 (*)    Glucose, Bld 119 (*)    All other components within normal limits    EKG None  Radiology Ct Head Wo Contrast  Result Date: 06/29/2018 CLINICAL DATA:  77 year old female status post fall backwards striking head on cement. EXAM: CT HEAD WITHOUT CONTRAST TECHNIQUE: Contiguous axial images were obtained from the base of the skull through the vertex without intravenous contrast. COMPARISON:  None. FINDINGS: Brain: Overall cerebral volume is normal for age. There is asymmetric enlargement of the right temporal horn with surrounding periventricular hypodensity which is indeterminate. Elsewhere the ventricular system appears normal. Other gray-white matter differentiation is normal throughout the brain. No midline shift, mass effect, intracranial hemorrhage or evidence of cortically based acute infarction. No cortical encephalomalacia identified. Vascular: Mild Calcified atherosclerosis at the skull base. Skull: Intact.  No osseous abnormality identified. Sinuses/Orbits: Visualized paranasal sinuses, tympanic cavities, and mastoids are clear. Other: Negative visible orbit and scalp soft tissues. No scalp hematoma identified. IMPRESSION: 1. Right temporal horn enlargement and periventricular white matter hypodensity is  nonspecific but does not appear posttraumatic. Etiology and significance are unclear. Brain MRI (without and with contrast preferred) would best evaluate further. 2. Otherwise normal for age noncontrast CT appearance of the brain. 3. No scalp hematoma or skull fracture identified. Electronically Signed   By: Genevie Ann M.D.   On: 06/29/2018 18:57   Mr Brain Wo Contrast  Result Date: 06/29/2018 CLINICAL DATA:  77 y/o  F; fall earlier today.  Abnormal head CT. EXAM: MRI HEAD WITHOUT CONTRAST TECHNIQUE: Multiplanar, multiecho pulse sequences of the brain and surrounding structures were obtained without intravenous contrast. COMPARISON:  06/29/2018 CT head. FINDINGS: Brain: No acute infarct, hemorrhage, hydrocephalus, extra-axial collection, or herniation. Bilobed cystic lesion measuring 12 x 9 x 11 mm (AP x ML x CC series 5, image 8 and series 10, image 16) centered within the right anterior temporal lobe subcortical white matter just above the temporal horn of right lateral ventricle. The cyst contents follow CSF on all sequences. There is a small surrounding region T2 FLAIR hyperintensity in white matter. Minimal local mass effect. Vascular: Normal flow voids. Skull and upper cervical spine: Normal marrow signal. Sinuses/Orbits: Negative. Other: Bilateral intra-ocular lens replacement.  IMPRESSION: Cystic lesion centered in the white matter of right anterior temporal lobe with small surrounding region of T2 FLAIR hyperintense signal abnormality. Broad differential includes neuroglial cyst, ganglioglioma, DNET, MVNT, or glial tumors such as pleomorphic Xanthroastrocytoma. MRI with contrast is recommended to assess for enhancing components. These results were called by telephone at the time of interpretation on 06/29/2018 at 9:25 pm to Dr. Julianne Rice , who verbally acknowledged these results. Electronically Signed   By: Kristine Garbe M.D.   On: 06/29/2018 21:34   Dg Hip Unilat W Or Wo Pelvis 2-3 Views  Right  Result Date: 06/29/2018 CLINICAL DATA:  77 year old female status post fall outside at 1500 hours today landing on right buttock. Pain. EXAM: DG HIP (WITH OR WITHOUT PELVIS) 2-3V RIGHT COMPARISON:  None. FINDINGS: Femoral heads are normally located. The hip joint spaces appear stable. There is mildly asymmetric increased acetabular subchondral sclerosis on the right. Intact pelvis. SI joints appear within normal limits. Left eccentric lower lumbar spine degeneration. Bilateral tubal ligation clips. Negative visible bowel gas pattern. Grossly intact proximal left femur. Intact proximal right femur. IMPRESSION: No acute fracture or dislocation identified about the right hip or pelvis. Electronically Signed   By: Genevie Ann M.D.   On: 06/29/2018 17:42    Procedures Procedures (including critical care time)  Medications Ordered in ED Medications - No data to display   Initial Impression / Assessment and Plan / ED Course  I have reviewed the triage vital signs and the nursing notes.  Pertinent labs & imaging results that were available during my care of the patient were reviewed by me and considered in my medical decision making (see chart for details).     CT head with hypodense area in the right temporal horn.  MRI recommended.  Patient had MRI without contrast that showed cystic lesion.  Discussed with radiologist.  Strongly recommend MRI with contrast.  I-STAT Chem-8 with normal creatinine.  Discuss[facility] Dina [facility] at [facility].  Will transfer patient to ER to get the MRI with contrast.  If no evidence of infectious or malignancy can be discharged home to follow-up as an outpatient.  Patient continues to have a normal neurologic exam.  Final Clinical Impressions(s) / ED Diagnoses   Final diagnoses:  Mass of brain  Contusion of scalp, initial encounter    ED Discharge Orders    None       Julianne Rice, MD 06/29/18 2256

## 2018-06-29 NOTE — ED Notes (Signed)
Patient went to MRI.

## 2018-06-30 ENCOUNTER — Emergency Department (HOSPITAL_COMMUNITY): Payer: Medicare Other

## 2018-06-30 DIAGNOSIS — G939 Disorder of brain, unspecified: Secondary | ICD-10-CM | POA: Diagnosis not present

## 2018-06-30 DIAGNOSIS — G930 Cerebral cysts: Secondary | ICD-10-CM | POA: Diagnosis not present

## 2018-06-30 MED ORDER — GADOBENATE DIMEGLUMINE 529 MG/ML IV SOLN
10.0000 mL | Freq: Once | INTRAVENOUS | Status: AC | PRN
  Administered 2018-06-30: 10 mL via INTRAVENOUS

## 2018-06-30 MED ORDER — ACETAMINOPHEN 500 MG PO TABS
1000.0000 mg | ORAL_TABLET | Freq: Once | ORAL | Status: AC
Start: 2018-06-30 — End: 2018-06-30
  Administered 2018-06-30: 1000 mg via ORAL
  Filled 2018-06-30: qty 2

## 2018-06-30 NOTE — ED Notes (Signed)
ED Provider at bedside. 

## 2018-06-30 NOTE — ED Notes (Signed)
Pt arrived from [facility] ED for MRI w/ contrast. Pt alert and oriented in no acute distress.

## 2018-06-30 NOTE — ED Notes (Signed)
Pt in MRI.

## 2018-06-30 NOTE — ED Provider Notes (Signed)
Patient transferred from [place] long after mechanical fall resulting in head trauma.  Incidental finding on CT scan requiring MRI noted a cystic lesion.  They requested MRI with contrast to assess for malignancy versus infectious process.  Rest of the work-up there was unremarkable.  MRI with contrast not suspicious for infectious or neoplastic process.  We will have patient follow-up outpatient for monitoring.  The patient is safe for discharge with strict return precautions.  Disposition: Discharge  Condition: Good  I have discussed the results, Dx and Tx plan with the patient who expressed understanding and agree(s) with the plan. Discharge instructions discussed at great length. The patient was given strict return precautions who verbalized understanding of the instructions. No further questions at time of discharge.    ED Discharge Orders    None       Follow Up: [facility] [address] [place], [place] [phone] Schedule an appointment as soon as possible for a visit  to follow up for brain cyst      Cardama, Grayce Sessions, MD 06/30/18 [phone]

## 2018-06-30 NOTE — ED Notes (Signed)
Family at bedside. 

## 2018-07-27 DIAGNOSIS — I10 Essential (primary) hypertension: Secondary | ICD-10-CM | POA: Diagnosis not present

## 2018-07-27 DIAGNOSIS — G930 Cerebral cysts: Secondary | ICD-10-CM | POA: Diagnosis not present

## 2018-08-12 DIAGNOSIS — D2271 Melanocytic nevi of right lower limb, including hip: Secondary | ICD-10-CM | POA: Diagnosis not present

## 2018-08-12 DIAGNOSIS — L821 Other seborrheic keratosis: Secondary | ICD-10-CM | POA: Diagnosis not present

## 2018-08-12 DIAGNOSIS — Z85828 Personal history of other malignant neoplasm of skin: Secondary | ICD-10-CM | POA: Diagnosis not present

## 2018-08-12 DIAGNOSIS — L570 Actinic keratosis: Secondary | ICD-10-CM | POA: Diagnosis not present

## 2018-08-12 DIAGNOSIS — D485 Neoplasm of uncertain behavior of skin: Secondary | ICD-10-CM | POA: Diagnosis not present

## 2018-08-12 DIAGNOSIS — D225 Melanocytic nevi of trunk: Secondary | ICD-10-CM | POA: Diagnosis not present

## 2018-08-12 DIAGNOSIS — C4441 Basal cell carcinoma of skin of scalp and neck: Secondary | ICD-10-CM | POA: Diagnosis not present

## 2018-08-12 DIAGNOSIS — Z85820 Personal history of malignant melanoma of skin: Secondary | ICD-10-CM | POA: Diagnosis not present

## 2018-08-12 DIAGNOSIS — D1801 Hemangioma of skin and subcutaneous tissue: Secondary | ICD-10-CM | POA: Diagnosis not present

## 2018-09-02 ENCOUNTER — Ambulatory Visit: Payer: Medicare Other | Admitting: Diagnostic Neuroimaging

## 2018-09-03 ENCOUNTER — Ambulatory Visit: Payer: Medicare Other | Admitting: Neurology

## 2018-09-05 DIAGNOSIS — Z23 Encounter for immunization: Secondary | ICD-10-CM | POA: Diagnosis not present

## 2018-09-10 DIAGNOSIS — C4441 Basal cell carcinoma of skin of scalp and neck: Secondary | ICD-10-CM | POA: Diagnosis not present

## 2018-09-10 DIAGNOSIS — Z85828 Personal history of other malignant neoplasm of skin: Secondary | ICD-10-CM | POA: Diagnosis not present

## 2018-09-24 DIAGNOSIS — Z4802 Encounter for removal of sutures: Secondary | ICD-10-CM | POA: Diagnosis not present

## 2018-12-21 IMAGING — CR DG HIP (WITH OR WITHOUT PELVIS) 2-3V*R*
3 series · 3 of 3 positions shown · non-contrast
Comparison: None.

CLINICAL DATA: 76-year-old female status post fall outside at 8166
hours today landing on right buttock. Pain.

EXAM:
DG HIP (WITH OR WITHOUT PELVIS) 2-3V RIGHT

[t pelvis ap]
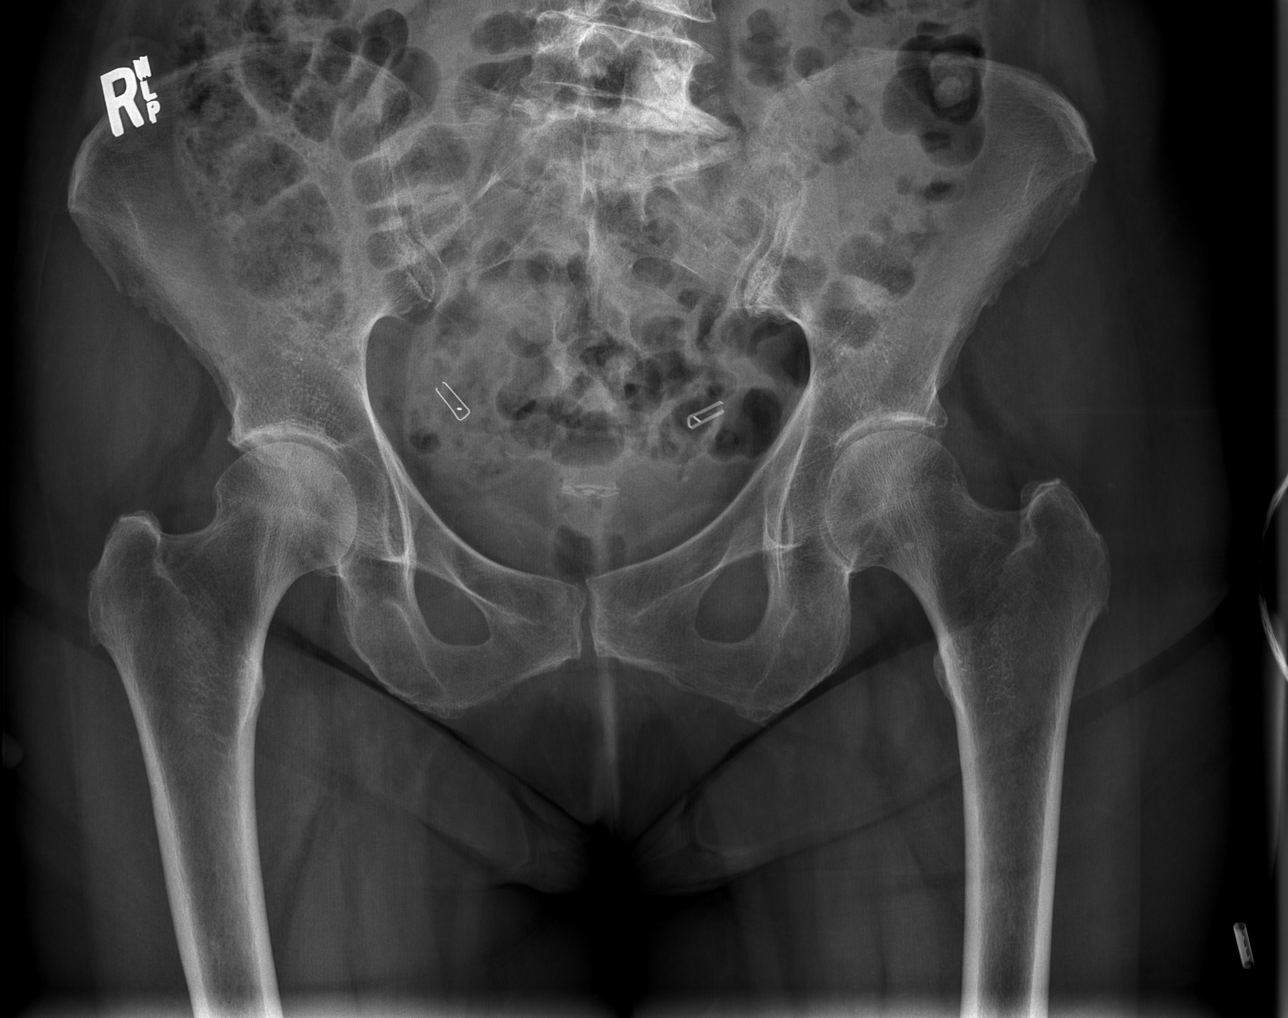

[t hip ap right]
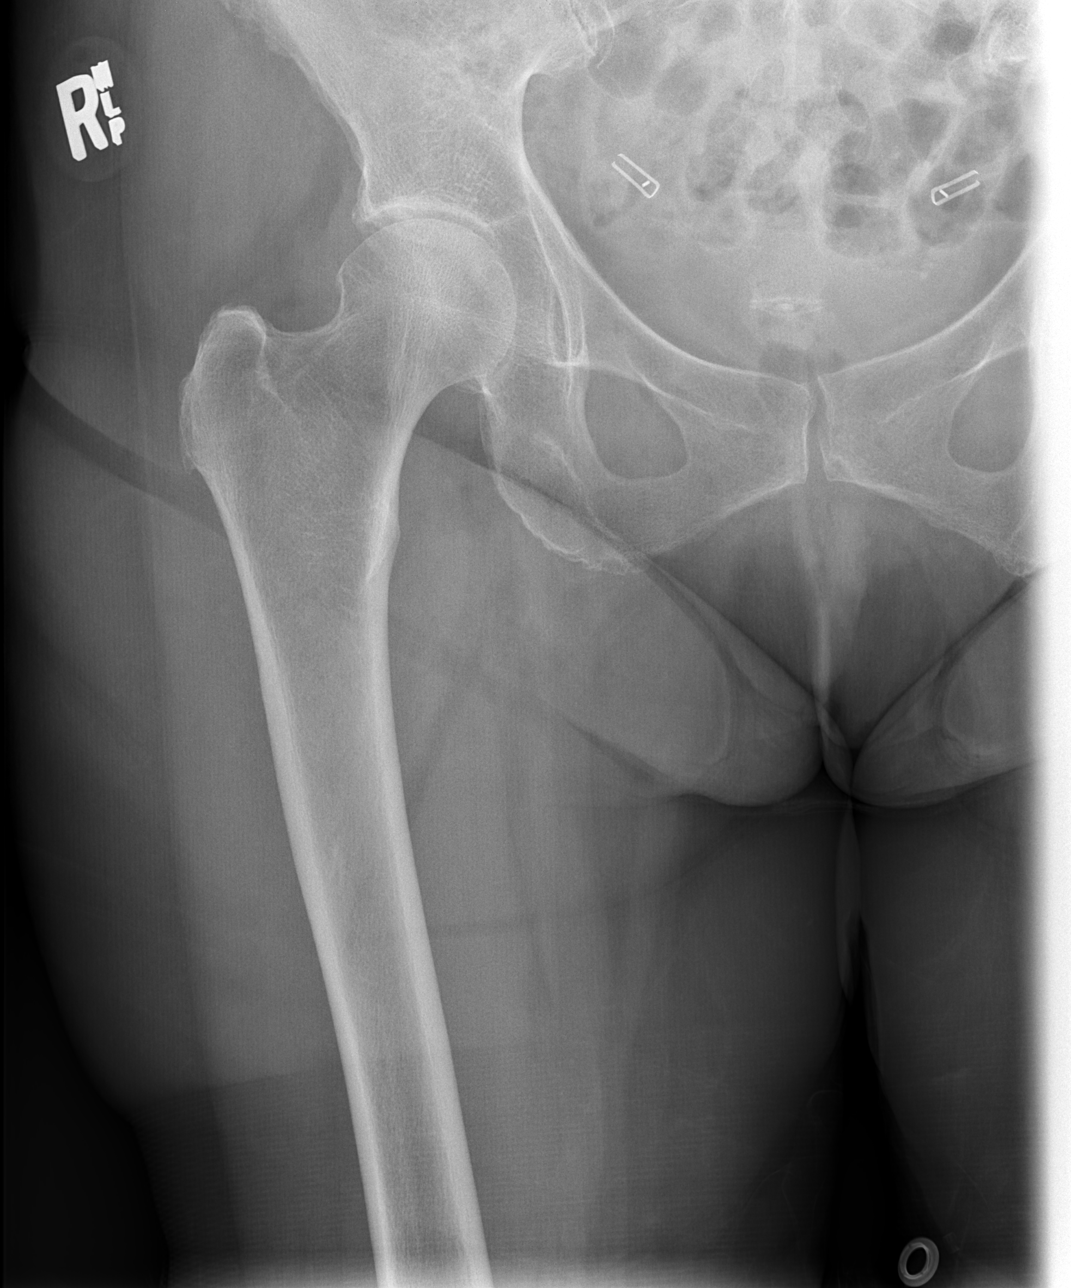

[t hip frog leg right]
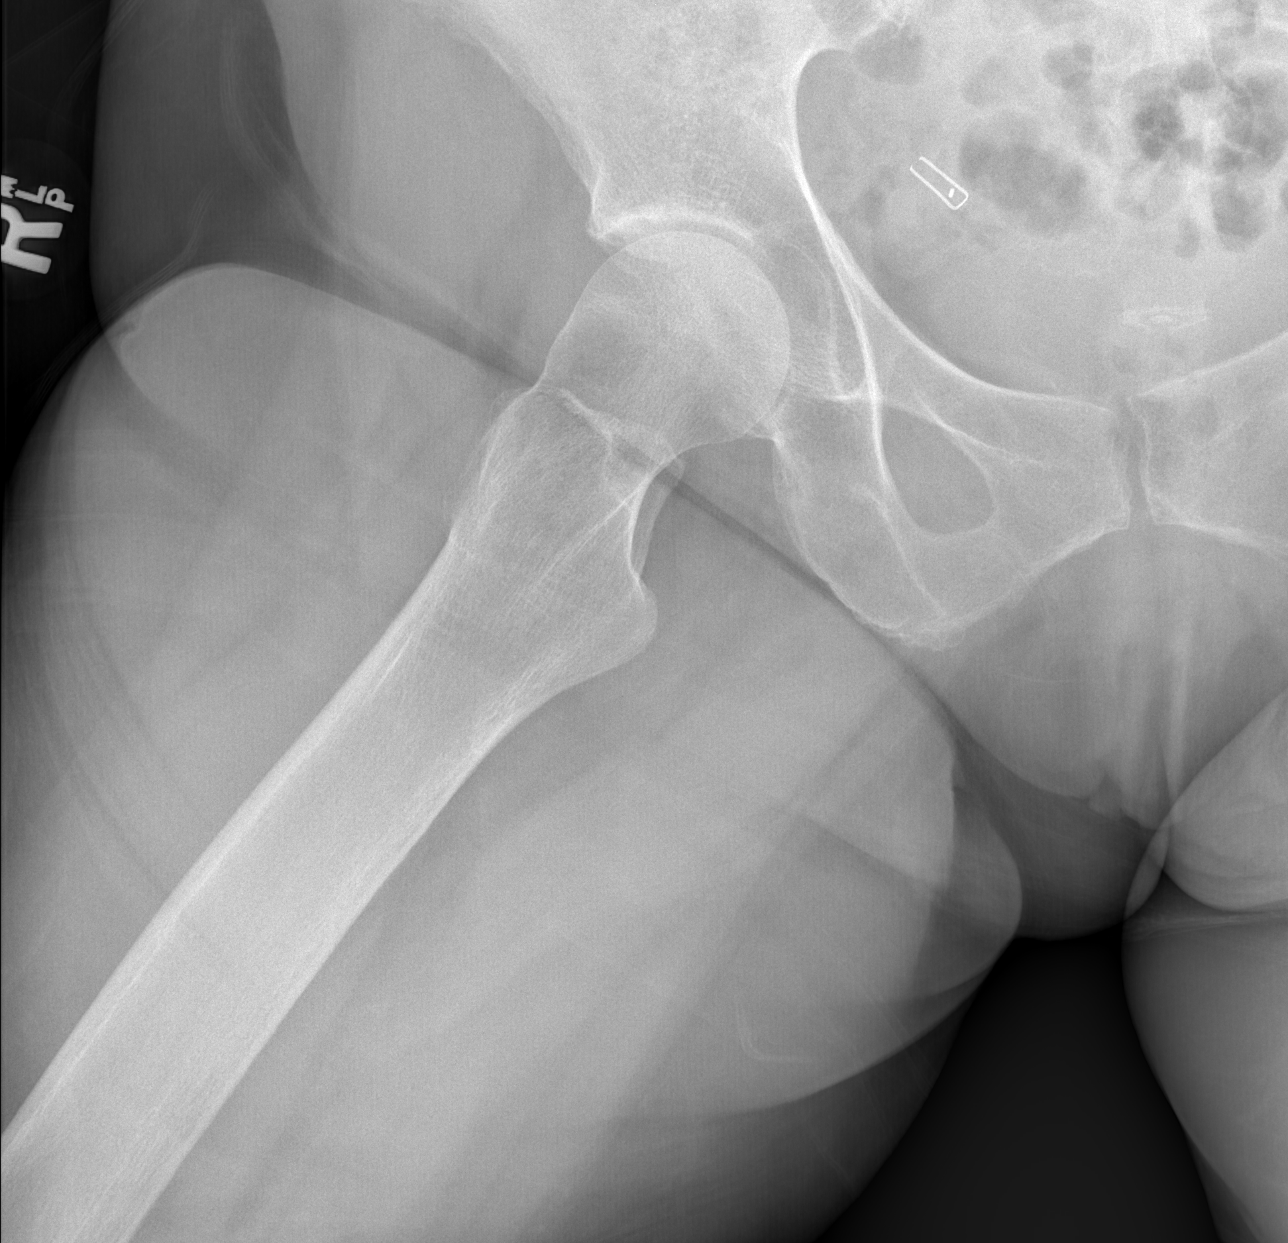

[3 of 3 positions shown; findings below may reference images not displayed]

FINDINGS: Femoral heads are normally located. The hip joint spaces appear
stable. There is mildly asymmetric increased acetabular subchondral
sclerosis on the right. Intact pelvis. SI joints appear within
normal limits. Left eccentric lower lumbar spine degeneration.

Bilateral tubal ligation clips. Negative visible bowel gas pattern.
Grossly intact proximal left femur. Intact proximal right femur.
IMPRESSION: No acute fracture or dislocation identified about the right hip or
pelvis.

## 2019-01-31 ENCOUNTER — Encounter (HOSPITAL_COMMUNITY): Payer: Self-pay

## 2019-01-31 ENCOUNTER — Ambulatory Visit (HOSPITAL_COMMUNITY)
Admission: EM | Admit: 2019-01-31 | Discharge: 2019-01-31 | Disposition: A | Discharge status: Home / Self Care | Payer: Medicare Other | Attending: Internal Medicine | Admitting: Internal Medicine

## 2019-01-31 DIAGNOSIS — S61412A Laceration without foreign body of left hand, initial encounter: Secondary | ICD-10-CM | POA: Diagnosis not present

## 2019-01-31 NOTE — ED Triage Notes (Signed)
Pt presents with laceration on top of right hand from plastic object.  Pt is on a blood thinner.

## 2019-02-01 NOTE — ED Provider Notes (Signed)
[facility]    CSN: [id number] Arrival date & time: 01/31/19  1729     History   Chief Complaint Chief Complaint  Patient presents with  . Extremity Laceration    HPI Barbara Bailey is a 78 y.o. female with a history of hypertension controlled with Benicar HCTZ comes to the urgent care for laceration on the right hand.  Patient was moving a plastic flower pot and sustained a cut on the dorsum of the right hand.  Cut is somewhat tender and was bleeding when she came to the urgent care.  Bleeding was controlled with pressure dressing.   HPI  Past Medical History:  Diagnosis Date  . Hypertension     There are no active problems to display for this patient.   Past Surgical History:  Procedure Laterality Date  . APPENDECTOMY      OB History   No obstetric history on file.      Home Medications    Prior to Admission medications   Medication Sig Start Date End Date Taking? Authorizing Provider  cholecalciferol (VITAMIN D) 1000 units tablet Take 1,000 Units by mouth daily.    [provider]  Multiple Vitamins-Minerals (MULTIVITAMIN ADULT) TABS Take 1 tablet by mouth daily.    [provider]  olmesartan-hydrochlorothiazide (BENICAR HCT) 20-12.5 MG tablet Take 1 tablet by mouth daily.    [provider]  Omega-3 Fatty Acids (FISH OIL) 1000 MG CAPS Take 1 capsule by mouth daily.    [provider]    Family History History reviewed. No pertinent family history.  Social History Social History   Tobacco Use  . Smoking status: Never Smoker  . Smokeless tobacco: Never Used  Substance Use Topics  . Alcohol use: Not on file  . Drug use: Not on file     Allergies   Doxycycline hyclate and Omeprazole   Review of Systems Review of Systems  Respiratory: Negative for shortness of breath and wheezing.   Musculoskeletal: Positive for arthralgias. Negative for joint swelling and myalgias.  Allergic/Immunologic: Negative  for environmental allergies and food allergies.  Neurological: Negative for dizziness, syncope and light-headedness.  Hematological: Negative for adenopathy. Does not bruise/bleed easily.  Psychiatric/Behavioral: Negative for agitation and confusion.     Physical Exam Triage Vital Signs ED Triage Vitals  Enc Vitals Group     BP 01/31/19 1822 (!) 171/69     Pulse Rate 01/31/19 1822 72     Resp 01/31/19 1822 17     Temp 01/31/19 1822 98.8 F (37.1 C)     Temp Source 01/31/19 1822 Oral     SpO2 01/31/19 1822 100 %     Weight --      Height --      Head Circumference --      Peak Flow --      Pain Score 01/31/19 1823 5     Pain Loc --      Pain Edu? --      Excl. in [place]? --    No data found.  Updated Vital Signs BP (!) 171/69 (BP Location: Left Arm)   Pulse 72   Temp 98.8 F (37.1 C) (Oral)   Resp 17   SpO2 100%   Visual Acuity Right Eye Distance:   Left Eye Distance:   Bilateral Distance:    Right Eye Near:   Left Eye Near:    Bilateral Near:     Physical Exam HENT:  Nose: Nose normal.  Eyes:     Extraocular Movements: Extraocular movements intact.     Pupils: Pupils are equal, round, and reactive to light.  Cardiovascular:     Rate and Rhythm: Normal rate and regular rhythm.     Pulses: Normal pulses.     Heart sounds: Normal heart sounds.  Pulmonary:     Effort: Pulmonary effort is normal.     Breath sounds: Normal breath sounds.  Abdominal:     General: Abdomen is flat. Bowel sounds are normal.     Palpations: Abdomen is soft.  Musculoskeletal: Normal range of motion.     Comments: Laceration on the dorsum of the right hand.  Minor bleeding  Skin:    General: Skin is warm.     Coloration: Skin is not pale.     Findings: No erythema or rash.     Comments: Thin skin      UC Treatments / Results  Labs (all labs ordered are listed, but only abnormal results are displayed) Labs Reviewed - No data to display  EKG None  Radiology No results  found.  Procedures Laceration Repair Date/Time: 02/02/2019 9:03 AM Performed by: Chase Picket, MD Authorized by: Chase Picket, MD   Consent:    Consent obtained:  Verbal   Consent given by:  Patient   Risks discussed:  Infection, need for additional repair, pain and poor wound healing Anesthesia (see MAR for exact dosages):    Anesthesia method:  None Laceration details:    Location:  Hand   Hand location:  R hand, dorsum Repair type:    Repair type:  Simple Exploration:    Hemostasis achieved with:  Direct pressure   Contaminated: no   Treatment:    Area cleansed with:  Betadine   Visualized foreign bodies/material removed: no   Skin repair:    Repair method:  Tissue adhesive Approximation:    Approximation:  Close Post-procedure details:    Dressing:  Antibiotic ointment and non-adherent dressing   Patient tolerance of procedure:  Tolerated well, no immediate complications   (including critical care time)  Medications Ordered in UC Medications - No data to display  Initial Impression / Assessment and Plan / UC Course  I have reviewed the triage vital signs and the nursing notes.  Pertinent labs & imaging results that were available during my care of the patient were reviewed by me and considered in my medical decision making (see chart for details).     1.  Laceration on dorsum of right hand: Laceration repair with adhesive B acitracin application  Final Clinical Impressions(s) / UC Diagnoses   Final diagnoses:  Laceration of left hand without foreign body, initial encounter   Discharge Instructions   None    ED Prescriptions    None     Controlled Substance Prescriptions [place] Controlled Substance Registry consulted? No   Chase Picket, MD 02/02/19 [phone]

## 2019-02-02 DIAGNOSIS — S61412A Laceration without foreign body of left hand, initial encounter: Secondary | ICD-10-CM | POA: Diagnosis not present

## 2019-02-03 DIAGNOSIS — S61411A Laceration without foreign body of right hand, initial encounter: Secondary | ICD-10-CM | POA: Diagnosis not present

## 2019-02-03 DIAGNOSIS — Z85828 Personal history of other malignant neoplasm of skin: Secondary | ICD-10-CM | POA: Diagnosis not present

## 2019-05-06 DIAGNOSIS — R7301 Impaired fasting glucose: Secondary | ICD-10-CM | POA: Diagnosis not present

## 2019-05-06 DIAGNOSIS — E7849 Other hyperlipidemia: Secondary | ICD-10-CM | POA: Diagnosis not present

## 2019-05-06 DIAGNOSIS — M810 Age-related osteoporosis without current pathological fracture: Secondary | ICD-10-CM | POA: Diagnosis not present

## 2019-05-06 DIAGNOSIS — I10 Essential (primary) hypertension: Secondary | ICD-10-CM | POA: Diagnosis not present

## 2019-05-14 DIAGNOSIS — I10 Essential (primary) hypertension: Secondary | ICD-10-CM | POA: Diagnosis not present

## 2019-05-14 DIAGNOSIS — R82998 Other abnormal findings in urine: Secondary | ICD-10-CM | POA: Diagnosis not present

## 2019-05-21 DIAGNOSIS — M549 Dorsalgia, unspecified: Secondary | ICD-10-CM | POA: Diagnosis not present

## 2019-05-21 DIAGNOSIS — R011 Cardiac murmur, unspecified: Secondary | ICD-10-CM | POA: Diagnosis not present

## 2019-05-21 DIAGNOSIS — I10 Essential (primary) hypertension: Secondary | ICD-10-CM | POA: Diagnosis not present

## 2019-05-21 DIAGNOSIS — E785 Hyperlipidemia, unspecified: Secondary | ICD-10-CM | POA: Diagnosis not present

## 2019-05-21 DIAGNOSIS — M1990 Unspecified osteoarthritis, unspecified site: Secondary | ICD-10-CM | POA: Diagnosis not present

## 2019-05-21 DIAGNOSIS — L309 Dermatitis, unspecified: Secondary | ICD-10-CM | POA: Diagnosis not present

## 2019-05-21 DIAGNOSIS — M810 Age-related osteoporosis without current pathological fracture: Secondary | ICD-10-CM | POA: Diagnosis not present

## 2019-05-21 DIAGNOSIS — R7301 Impaired fasting glucose: Secondary | ICD-10-CM | POA: Diagnosis not present

## 2019-05-21 DIAGNOSIS — J309 Allergic rhinitis, unspecified: Secondary | ICD-10-CM | POA: Diagnosis not present

## 2019-05-21 DIAGNOSIS — Z0000 Encounter for general adult medical examination without abnormal findings: Secondary | ICD-10-CM | POA: Diagnosis not present

## 2019-05-21 DIAGNOSIS — C4491 Basal cell carcinoma of skin, unspecified: Secondary | ICD-10-CM | POA: Diagnosis not present

## 2019-05-21 DIAGNOSIS — Z1331 Encounter for screening for depression: Secondary | ICD-10-CM | POA: Diagnosis not present

## 2019-05-31 ENCOUNTER — Other Ambulatory Visit: Payer: Self-pay | Admitting: Internal Medicine

## 2019-05-31 DIAGNOSIS — E785 Hyperlipidemia, unspecified: Secondary | ICD-10-CM

## 2019-06-10 DIAGNOSIS — L570 Actinic keratosis: Secondary | ICD-10-CM | POA: Diagnosis not present

## 2019-06-10 DIAGNOSIS — L820 Inflamed seborrheic keratosis: Secondary | ICD-10-CM | POA: Diagnosis not present

## 2019-06-10 DIAGNOSIS — L821 Other seborrheic keratosis: Secondary | ICD-10-CM | POA: Diagnosis not present

## 2019-06-10 DIAGNOSIS — D2372 Other benign neoplasm of skin of left lower limb, including hip: Secondary | ICD-10-CM | POA: Diagnosis not present

## 2019-06-10 DIAGNOSIS — Z85820 Personal history of malignant melanoma of skin: Secondary | ICD-10-CM | POA: Diagnosis not present

## 2019-06-10 DIAGNOSIS — Z85828 Personal history of other malignant neoplasm of skin: Secondary | ICD-10-CM | POA: Diagnosis not present

## 2019-08-02 DIAGNOSIS — Z1231 Encounter for screening mammogram for malignant neoplasm of breast: Secondary | ICD-10-CM | POA: Diagnosis not present

## 2019-09-08 DIAGNOSIS — L820 Inflamed seborrheic keratosis: Secondary | ICD-10-CM | POA: Diagnosis not present

## 2019-09-08 DIAGNOSIS — Z85828 Personal history of other malignant neoplasm of skin: Secondary | ICD-10-CM | POA: Diagnosis not present

## 2019-09-08 DIAGNOSIS — D485 Neoplasm of uncertain behavior of skin: Secondary | ICD-10-CM | POA: Diagnosis not present

## 2019-09-25 DIAGNOSIS — Z23 Encounter for immunization: Secondary | ICD-10-CM | POA: Diagnosis not present

## 2019-11-25 DIAGNOSIS — H43813 Vitreous degeneration, bilateral: Secondary | ICD-10-CM | POA: Diagnosis not present

## 2019-11-25 DIAGNOSIS — Z961 Presence of intraocular lens: Secondary | ICD-10-CM | POA: Diagnosis not present

## 2019-11-25 DIAGNOSIS — H524 Presbyopia: Secondary | ICD-10-CM | POA: Diagnosis not present

## 2019-11-25 DIAGNOSIS — H26491 Other secondary cataract, right eye: Secondary | ICD-10-CM | POA: Diagnosis not present

## 2020-02-16 DIAGNOSIS — D225 Melanocytic nevi of trunk: Secondary | ICD-10-CM | POA: Diagnosis not present

## 2020-02-16 DIAGNOSIS — D2261 Melanocytic nevi of right upper limb, including shoulder: Secondary | ICD-10-CM | POA: Diagnosis not present

## 2020-02-16 DIAGNOSIS — L821 Other seborrheic keratosis: Secondary | ICD-10-CM | POA: Diagnosis not present

## 2020-02-16 DIAGNOSIS — Z85828 Personal history of other malignant neoplasm of skin: Secondary | ICD-10-CM | POA: Diagnosis not present

## 2020-02-16 DIAGNOSIS — Z85820 Personal history of malignant melanoma of skin: Secondary | ICD-10-CM | POA: Diagnosis not present

## 2020-02-16 DIAGNOSIS — D2262 Melanocytic nevi of left upper limb, including shoulder: Secondary | ICD-10-CM | POA: Diagnosis not present

## 2020-02-16 DIAGNOSIS — D224 Melanocytic nevi of scalp and neck: Secondary | ICD-10-CM | POA: Diagnosis not present

## 2020-08-15 DIAGNOSIS — L821 Other seborrheic keratosis: Secondary | ICD-10-CM | POA: Diagnosis not present

## 2020-08-15 DIAGNOSIS — D2372 Other benign neoplasm of skin of left lower limb, including hip: Secondary | ICD-10-CM | POA: Diagnosis not present

## 2020-08-15 DIAGNOSIS — Z85820 Personal history of malignant melanoma of skin: Secondary | ICD-10-CM | POA: Diagnosis not present

## 2020-08-15 DIAGNOSIS — D2272 Melanocytic nevi of left lower limb, including hip: Secondary | ICD-10-CM | POA: Diagnosis not present

## 2020-08-15 DIAGNOSIS — Z85828 Personal history of other malignant neoplasm of skin: Secondary | ICD-10-CM | POA: Diagnosis not present

## 2020-08-15 DIAGNOSIS — D1801 Hemangioma of skin and subcutaneous tissue: Secondary | ICD-10-CM | POA: Diagnosis not present

## 2020-08-15 DIAGNOSIS — D2362 Other benign neoplasm of skin of left upper limb, including shoulder: Secondary | ICD-10-CM | POA: Diagnosis not present

## 2020-08-15 DIAGNOSIS — L570 Actinic keratosis: Secondary | ICD-10-CM | POA: Diagnosis not present

## 2020-08-19 ENCOUNTER — Ambulatory Visit: Payer: Medicare Other | Attending: Internal Medicine

## 2020-08-19 DIAGNOSIS — Z23 Encounter for immunization: Secondary | ICD-10-CM

## 2020-08-19 NOTE — Progress Notes (Signed)
   Covid-19 Vaccination Clinic  Name:  Barbara Bailey    MRN: [id number] DOB: Apr 25, 1941  08/19/2020  Barbara Bailey was observed post Covid-19 immunization for 15 minutes without incident. She was provided with Vaccine Information Sheet and instruction to access the V-Safe system.   Barbara Bailey was instructed to call 911 with any severe reactions post vaccine: Marland Kitchen Difficulty breathing  . Swelling of face and throat  . A fast heartbeat  . A bad rash all over body  . Dizziness and weakness

## 2020-09-30 DIAGNOSIS — Z23 Encounter for immunization: Secondary | ICD-10-CM | POA: Diagnosis not present

## 2020-11-24 DIAGNOSIS — Z961 Presence of intraocular lens: Secondary | ICD-10-CM | POA: Diagnosis not present

## 2020-11-24 DIAGNOSIS — H26491 Other secondary cataract, right eye: Secondary | ICD-10-CM | POA: Diagnosis not present

## 2020-11-24 DIAGNOSIS — H01001 Unspecified blepharitis right upper eyelid: Secondary | ICD-10-CM | POA: Diagnosis not present

## 2020-11-24 DIAGNOSIS — H5212 Myopia, left eye: Secondary | ICD-10-CM | POA: Diagnosis not present

## 2021-02-06 DIAGNOSIS — Z1231 Encounter for screening mammogram for malignant neoplasm of breast: Secondary | ICD-10-CM | POA: Diagnosis not present

## 2021-02-15 DIAGNOSIS — D225 Melanocytic nevi of trunk: Secondary | ICD-10-CM | POA: Diagnosis not present

## 2021-02-15 DIAGNOSIS — Z85828 Personal history of other malignant neoplasm of skin: Secondary | ICD-10-CM | POA: Diagnosis not present

## 2021-02-15 DIAGNOSIS — D1801 Hemangioma of skin and subcutaneous tissue: Secondary | ICD-10-CM | POA: Diagnosis not present

## 2021-02-15 DIAGNOSIS — Z85820 Personal history of malignant melanoma of skin: Secondary | ICD-10-CM | POA: Diagnosis not present

## 2021-02-15 DIAGNOSIS — L821 Other seborrheic keratosis: Secondary | ICD-10-CM | POA: Diagnosis not present

## 2021-02-26 ENCOUNTER — Ambulatory Visit: Payer: Medicare Other | Attending: Internal Medicine

## 2021-02-26 DIAGNOSIS — Z23 Encounter for immunization: Secondary | ICD-10-CM

## 2021-06-23 DIAGNOSIS — L84 Corns and callosities: Secondary | ICD-10-CM | POA: Diagnosis not present

## 2021-06-23 DIAGNOSIS — B070 Plantar wart: Secondary | ICD-10-CM | POA: Diagnosis not present

## 2021-07-30 DIAGNOSIS — E785 Hyperlipidemia, unspecified: Secondary | ICD-10-CM | POA: Diagnosis not present

## 2021-08-06 ENCOUNTER — Other Ambulatory Visit: Payer: Self-pay | Admitting: Internal Medicine

## 2021-08-06 DIAGNOSIS — E785 Hyperlipidemia, unspecified: Secondary | ICD-10-CM

## 2021-08-06 DIAGNOSIS — I10 Essential (primary) hypertension: Secondary | ICD-10-CM | POA: Diagnosis not present

## 2021-08-06 DIAGNOSIS — R82998 Other abnormal findings in urine: Secondary | ICD-10-CM | POA: Diagnosis not present

## 2021-08-06 DIAGNOSIS — M810 Age-related osteoporosis without current pathological fracture: Secondary | ICD-10-CM | POA: Diagnosis not present

## 2021-08-06 DIAGNOSIS — Z79899 Other long term (current) drug therapy: Secondary | ICD-10-CM | POA: Diagnosis not present

## 2021-08-06 DIAGNOSIS — R7301 Impaired fasting glucose: Secondary | ICD-10-CM | POA: Diagnosis not present

## 2021-08-06 DIAGNOSIS — R011 Cardiac murmur, unspecified: Secondary | ICD-10-CM | POA: Diagnosis not present

## 2021-08-06 DIAGNOSIS — Z0000 Encounter for general adult medical examination without abnormal findings: Secondary | ICD-10-CM | POA: Diagnosis not present

## 2021-08-06 DIAGNOSIS — R809 Proteinuria, unspecified: Secondary | ICD-10-CM | POA: Diagnosis not present

## 2021-08-16 DIAGNOSIS — Z85828 Personal history of other malignant neoplasm of skin: Secondary | ICD-10-CM | POA: Diagnosis not present

## 2021-08-16 DIAGNOSIS — L821 Other seborrheic keratosis: Secondary | ICD-10-CM | POA: Diagnosis not present

## 2021-08-16 DIAGNOSIS — D2361 Other benign neoplasm of skin of right upper limb, including shoulder: Secondary | ICD-10-CM | POA: Diagnosis not present

## 2021-08-16 DIAGNOSIS — D1801 Hemangioma of skin and subcutaneous tissue: Secondary | ICD-10-CM | POA: Diagnosis not present

## 2021-08-16 DIAGNOSIS — Z85820 Personal history of malignant melanoma of skin: Secondary | ICD-10-CM | POA: Diagnosis not present

## 2021-08-16 DIAGNOSIS — D225 Melanocytic nevi of trunk: Secondary | ICD-10-CM | POA: Diagnosis not present

## 2021-09-04 DIAGNOSIS — M810 Age-related osteoporosis without current pathological fracture: Secondary | ICD-10-CM | POA: Diagnosis not present

## 2021-09-10 ENCOUNTER — Ambulatory Visit: Payer: Medicare Other | Attending: Internal Medicine

## 2021-09-10 ENCOUNTER — Other Ambulatory Visit (HOSPITAL_BASED_OUTPATIENT_CLINIC_OR_DEPARTMENT_OTHER): Payer: Self-pay

## 2021-09-10 DIAGNOSIS — Z23 Encounter for immunization: Secondary | ICD-10-CM

## 2021-09-10 MED ORDER — PFIZER COVID-19 VAC BIVALENT 30 MCG/0.3ML IM SUSP
INTRAMUSCULAR | 0 refills | Status: AC
  Filled 2021-09-10: qty 0.3, 1d supply, fill #0

## 2021-09-10 NOTE — Progress Notes (Signed)
   Covid-19 Vaccination Clinic  Name:  Barbara Bailey    MRN: [id number] DOB: Mar 21, 1941  09/10/2021  [name] was observed post Covid-19 immunization for 15 minutes without incident. She was provided with Vaccine Information Sheet and instruction to access the V-Safe system.   [name] was instructed to call 911 with any severe reactions post vaccine: Difficulty breathing  Swelling of face and throat  A fast heartbeat  A bad rash all over body  Dizziness and weakness

## 2021-09-11 ENCOUNTER — Other Ambulatory Visit (HOSPITAL_BASED_OUTPATIENT_CLINIC_OR_DEPARTMENT_OTHER): Payer: Self-pay

## 2021-09-29 DIAGNOSIS — Z23 Encounter for immunization: Secondary | ICD-10-CM | POA: Diagnosis not present

## 2021-11-12 DIAGNOSIS — Z23 Encounter for immunization: Secondary | ICD-10-CM | POA: Diagnosis not present

## 2021-12-05 DIAGNOSIS — Z961 Presence of intraocular lens: Secondary | ICD-10-CM | POA: Diagnosis not present

## 2021-12-05 DIAGNOSIS — H26491 Other secondary cataract, right eye: Secondary | ICD-10-CM | POA: Diagnosis not present

## 2021-12-05 DIAGNOSIS — H524 Presbyopia: Secondary | ICD-10-CM | POA: Diagnosis not present

## 2022-02-12 DIAGNOSIS — Z1231 Encounter for screening mammogram for malignant neoplasm of breast: Secondary | ICD-10-CM | POA: Diagnosis not present

## 2022-03-18 ENCOUNTER — Other Ambulatory Visit (HOSPITAL_BASED_OUTPATIENT_CLINIC_OR_DEPARTMENT_OTHER): Payer: Self-pay

## 2022-04-23 ENCOUNTER — Ambulatory Visit: Payer: Medicare Other | Attending: Internal Medicine

## 2022-04-23 ENCOUNTER — Other Ambulatory Visit (HOSPITAL_BASED_OUTPATIENT_CLINIC_OR_DEPARTMENT_OTHER): Payer: Self-pay

## 2022-04-23 DIAGNOSIS — Z23 Encounter for immunization: Secondary | ICD-10-CM

## 2022-04-23 MED ORDER — PFIZER COVID-19 VAC BIVALENT 30 MCG/0.3ML IM SUSP
INTRAMUSCULAR | 0 refills | Status: AC
  Filled 2022-04-23: qty 0.3, 1d supply, fill #0

## 2022-04-23 NOTE — Progress Notes (Signed)
? ?  Covid-19 Vaccination Clinic ? ?Name:  Barbara Bailey    ?MRN: [id number] ?DOB: 02-Apr-1941 ? ?04/23/2022 ? ?[name] was observed post Covid-19 immunization for 15 minutes without incident. She was provided with Vaccine Information Sheet and instruction to access the V-Safe system.  ? ?[name] was instructed to call 911 with any severe reactions post vaccine: ?Difficulty breathing  ?Swelling of face and throat  ?A fast heartbeat  ?A bad rash all over body  ?Dizziness and weakness  ? ?Immunizations Administered   ? ? Name Date Dose VIS Date Route  ? Ambulance person Booster 04/23/2022 12:19 PM 0.3 mL 08/22/2021 Intramuscular  ? Manufacturer: [place]: [id number]  ? [place]: [phone]  ? ?  ? ? ?

## 2022-05-01 DIAGNOSIS — D2372 Other benign neoplasm of skin of left lower limb, including hip: Secondary | ICD-10-CM | POA: Diagnosis not present

## 2022-05-01 DIAGNOSIS — D225 Melanocytic nevi of trunk: Secondary | ICD-10-CM | POA: Diagnosis not present

## 2022-05-01 DIAGNOSIS — D2361 Other benign neoplasm of skin of right upper limb, including shoulder: Secondary | ICD-10-CM | POA: Diagnosis not present

## 2022-05-01 DIAGNOSIS — Z85828 Personal history of other malignant neoplasm of skin: Secondary | ICD-10-CM | POA: Diagnosis not present

## 2022-05-01 DIAGNOSIS — L821 Other seborrheic keratosis: Secondary | ICD-10-CM | POA: Diagnosis not present

## 2022-05-01 DIAGNOSIS — Z85820 Personal history of malignant melanoma of skin: Secondary | ICD-10-CM | POA: Diagnosis not present

## 2022-05-01 DIAGNOSIS — L820 Inflamed seborrheic keratosis: Secondary | ICD-10-CM | POA: Diagnosis not present

## 2022-05-01 DIAGNOSIS — D1801 Hemangioma of skin and subcutaneous tissue: Secondary | ICD-10-CM | POA: Diagnosis not present

## 2022-05-01 DIAGNOSIS — D2272 Melanocytic nevi of left lower limb, including hip: Secondary | ICD-10-CM | POA: Diagnosis not present

## 2022-05-01 DIAGNOSIS — L570 Actinic keratosis: Secondary | ICD-10-CM | POA: Diagnosis not present

## 2022-07-08 DIAGNOSIS — Z85828 Personal history of other malignant neoplasm of skin: Secondary | ICD-10-CM | POA: Diagnosis not present

## 2022-07-08 DIAGNOSIS — L820 Inflamed seborrheic keratosis: Secondary | ICD-10-CM | POA: Diagnosis not present

## 2022-08-06 DIAGNOSIS — Z85828 Personal history of other malignant neoplasm of skin: Secondary | ICD-10-CM | POA: Diagnosis not present

## 2022-08-06 DIAGNOSIS — S40861S Insect bite (nonvenomous) of right upper arm, sequela: Secondary | ICD-10-CM | POA: Diagnosis not present

## 2022-08-06 DIAGNOSIS — L820 Inflamed seborrheic keratosis: Secondary | ICD-10-CM | POA: Diagnosis not present

## 2022-09-18 DIAGNOSIS — K6289 Other specified diseases of anus and rectum: Secondary | ICD-10-CM | POA: Diagnosis not present

## 2022-09-28 DIAGNOSIS — Z23 Encounter for immunization: Secondary | ICD-10-CM | POA: Diagnosis not present

## 2022-10-11 ENCOUNTER — Other Ambulatory Visit (HOSPITAL_BASED_OUTPATIENT_CLINIC_OR_DEPARTMENT_OTHER): Payer: Self-pay

## 2022-10-11 DIAGNOSIS — Z23 Encounter for immunization: Secondary | ICD-10-CM | POA: Diagnosis not present

## 2022-10-11 MED ORDER — COVID-19 MRNA VAC-TRIS(PFIZER) 30 MCG/0.3ML IM SUSY
PREFILLED_SYRINGE | INTRAMUSCULAR | 0 refills | Status: AC
  Filled 2022-10-11: qty 0.3, 1d supply, fill #0

## 2022-10-28 DIAGNOSIS — L820 Inflamed seborrheic keratosis: Secondary | ICD-10-CM | POA: Diagnosis not present

## 2022-10-28 DIAGNOSIS — L821 Other seborrheic keratosis: Secondary | ICD-10-CM | POA: Diagnosis not present

## 2022-10-28 DIAGNOSIS — Z85828 Personal history of other malignant neoplasm of skin: Secondary | ICD-10-CM | POA: Diagnosis not present

## 2022-10-28 DIAGNOSIS — L309 Dermatitis, unspecified: Secondary | ICD-10-CM | POA: Diagnosis not present

## 2022-12-18 DIAGNOSIS — H52203 Unspecified astigmatism, bilateral: Secondary | ICD-10-CM | POA: Diagnosis not present

## 2022-12-18 DIAGNOSIS — Z961 Presence of intraocular lens: Secondary | ICD-10-CM | POA: Diagnosis not present

## 2023-03-12 DIAGNOSIS — I10 Essential (primary) hypertension: Secondary | ICD-10-CM | POA: Diagnosis not present

## 2023-03-12 DIAGNOSIS — M810 Age-related osteoporosis without current pathological fracture: Secondary | ICD-10-CM | POA: Diagnosis not present

## 2023-04-18 DIAGNOSIS — E785 Hyperlipidemia, unspecified: Secondary | ICD-10-CM | POA: Diagnosis not present

## 2023-04-18 DIAGNOSIS — R7989 Other specified abnormal findings of blood chemistry: Secondary | ICD-10-CM | POA: Diagnosis not present

## 2023-04-18 DIAGNOSIS — R7301 Impaired fasting glucose: Secondary | ICD-10-CM | POA: Diagnosis not present

## 2023-04-18 DIAGNOSIS — M810 Age-related osteoporosis without current pathological fracture: Secondary | ICD-10-CM | POA: Diagnosis not present

## 2023-04-18 DIAGNOSIS — I10 Essential (primary) hypertension: Secondary | ICD-10-CM | POA: Diagnosis not present

## 2023-04-25 ENCOUNTER — Other Ambulatory Visit: Payer: Self-pay | Admitting: Internal Medicine

## 2023-04-25 DIAGNOSIS — R82998 Other abnormal findings in urine: Secondary | ICD-10-CM | POA: Diagnosis not present

## 2023-04-25 DIAGNOSIS — K6289 Other specified diseases of anus and rectum: Secondary | ICD-10-CM | POA: Diagnosis not present

## 2023-04-25 DIAGNOSIS — H6980 Other specified disorders of Eustachian tube, unspecified ear: Secondary | ICD-10-CM | POA: Diagnosis not present

## 2023-04-25 DIAGNOSIS — C4491 Basal cell carcinoma of skin, unspecified: Secondary | ICD-10-CM | POA: Diagnosis not present

## 2023-04-25 DIAGNOSIS — J309 Allergic rhinitis, unspecified: Secondary | ICD-10-CM | POA: Diagnosis not present

## 2023-04-25 DIAGNOSIS — L309 Dermatitis, unspecified: Secondary | ICD-10-CM | POA: Diagnosis not present

## 2023-04-25 DIAGNOSIS — E785 Hyperlipidemia, unspecified: Secondary | ICD-10-CM

## 2023-04-25 DIAGNOSIS — M810 Age-related osteoporosis without current pathological fracture: Secondary | ICD-10-CM | POA: Diagnosis not present

## 2023-04-25 DIAGNOSIS — I10 Essential (primary) hypertension: Secondary | ICD-10-CM | POA: Diagnosis not present

## 2023-04-25 DIAGNOSIS — Z0000 Encounter for general adult medical examination without abnormal findings: Secondary | ICD-10-CM | POA: Diagnosis not present

## 2023-04-25 DIAGNOSIS — G930 Cerebral cysts: Secondary | ICD-10-CM | POA: Diagnosis not present

## 2023-04-25 DIAGNOSIS — K649 Unspecified hemorrhoids: Secondary | ICD-10-CM | POA: Diagnosis not present

## 2023-04-25 DIAGNOSIS — K589 Irritable bowel syndrome without diarrhea: Secondary | ICD-10-CM | POA: Diagnosis not present

## 2023-04-28 DIAGNOSIS — D485 Neoplasm of uncertain behavior of skin: Secondary | ICD-10-CM | POA: Diagnosis not present

## 2023-04-28 DIAGNOSIS — Z85828 Personal history of other malignant neoplasm of skin: Secondary | ICD-10-CM | POA: Diagnosis not present

## 2023-04-28 DIAGNOSIS — D225 Melanocytic nevi of trunk: Secondary | ICD-10-CM | POA: Diagnosis not present

## 2023-04-28 DIAGNOSIS — D2261 Melanocytic nevi of right upper limb, including shoulder: Secondary | ICD-10-CM | POA: Diagnosis not present

## 2023-04-28 DIAGNOSIS — D2361 Other benign neoplasm of skin of right upper limb, including shoulder: Secondary | ICD-10-CM | POA: Diagnosis not present

## 2023-04-28 DIAGNOSIS — C4441 Basal cell carcinoma of skin of scalp and neck: Secondary | ICD-10-CM | POA: Diagnosis not present

## 2023-04-28 DIAGNOSIS — L821 Other seborrheic keratosis: Secondary | ICD-10-CM | POA: Diagnosis not present

## 2023-05-05 DIAGNOSIS — Z1231 Encounter for screening mammogram for malignant neoplasm of breast: Secondary | ICD-10-CM | POA: Diagnosis not present

## 2023-07-08 ENCOUNTER — Other Ambulatory Visit (HOSPITAL_BASED_OUTPATIENT_CLINIC_OR_DEPARTMENT_OTHER): Payer: Self-pay

## 2023-09-09 ENCOUNTER — Other Ambulatory Visit (HOSPITAL_BASED_OUTPATIENT_CLINIC_OR_DEPARTMENT_OTHER): Payer: Self-pay

## 2023-10-04 DIAGNOSIS — Z23 Encounter for immunization: Secondary | ICD-10-CM | POA: Diagnosis not present

## 2023-10-24 ENCOUNTER — Other Ambulatory Visit (HOSPITAL_BASED_OUTPATIENT_CLINIC_OR_DEPARTMENT_OTHER): Payer: Self-pay

## 2023-10-24 DIAGNOSIS — Z23 Encounter for immunization: Secondary | ICD-10-CM | POA: Diagnosis not present

## 2023-10-24 MED ORDER — COMIRNATY 30 MCG/0.3ML IM SUSY
PREFILLED_SYRINGE | INTRAMUSCULAR | 0 refills | Status: AC
  Filled 2023-10-24: qty 0.3, 1d supply, fill #0

## 2023-10-29 DIAGNOSIS — D2239 Melanocytic nevi of other parts of face: Secondary | ICD-10-CM | POA: Diagnosis not present

## 2023-10-29 DIAGNOSIS — Z85828 Personal history of other malignant neoplasm of skin: Secondary | ICD-10-CM | POA: Diagnosis not present

## 2023-10-29 DIAGNOSIS — L821 Other seborrheic keratosis: Secondary | ICD-10-CM | POA: Diagnosis not present

## 2023-10-29 DIAGNOSIS — D225 Melanocytic nevi of trunk: Secondary | ICD-10-CM | POA: Diagnosis not present

## 2023-10-29 DIAGNOSIS — Z85820 Personal history of malignant melanoma of skin: Secondary | ICD-10-CM | POA: Diagnosis not present

## 2023-10-29 DIAGNOSIS — L820 Inflamed seborrheic keratosis: Secondary | ICD-10-CM | POA: Diagnosis not present

## 2023-10-29 DIAGNOSIS — L570 Actinic keratosis: Secondary | ICD-10-CM | POA: Diagnosis not present

## 2023-11-01 ENCOUNTER — Other Ambulatory Visit (HOSPITAL_BASED_OUTPATIENT_CLINIC_OR_DEPARTMENT_OTHER): Payer: Self-pay

## 2024-01-30 DIAGNOSIS — Z961 Presence of intraocular lens: Secondary | ICD-10-CM | POA: Diagnosis not present

## 2024-01-30 DIAGNOSIS — H52203 Unspecified astigmatism, bilateral: Secondary | ICD-10-CM | POA: Diagnosis not present

## 2024-03-11 DIAGNOSIS — M810 Age-related osteoporosis without current pathological fracture: Secondary | ICD-10-CM | POA: Diagnosis not present

## 2024-03-11 DIAGNOSIS — I10 Essential (primary) hypertension: Secondary | ICD-10-CM | POA: Diagnosis not present

## 2024-03-11 DIAGNOSIS — M549 Dorsalgia, unspecified: Secondary | ICD-10-CM | POA: Diagnosis not present

## 2024-03-11 DIAGNOSIS — S32040A Wedge compression fracture of fourth lumbar vertebra, initial encounter for closed fracture: Secondary | ICD-10-CM | POA: Diagnosis not present

## 2024-03-11 DIAGNOSIS — M545 Low back pain, unspecified: Secondary | ICD-10-CM | POA: Diagnosis not present

## 2024-03-11 DIAGNOSIS — S32030A Wedge compression fracture of third lumbar vertebra, initial encounter for closed fracture: Secondary | ICD-10-CM | POA: Diagnosis not present

## 2024-03-11 DIAGNOSIS — M797 Fibromyalgia: Secondary | ICD-10-CM | POA: Diagnosis not present

## 2024-03-11 DIAGNOSIS — M47816 Spondylosis without myelopathy or radiculopathy, lumbar region: Secondary | ICD-10-CM | POA: Diagnosis not present

## 2024-03-18 ENCOUNTER — Other Ambulatory Visit: Payer: Self-pay

## 2024-03-18 ENCOUNTER — Emergency Department (HOSPITAL_BASED_OUTPATIENT_CLINIC_OR_DEPARTMENT_OTHER): Admitting: Radiology

## 2024-03-18 ENCOUNTER — Emergency Department (HOSPITAL_BASED_OUTPATIENT_CLINIC_OR_DEPARTMENT_OTHER)
Admission: EM | Admit: 2024-03-18 | Discharge: 2024-03-18 | Disposition: A | Discharge status: Home / Self Care | Attending: Emergency Medicine | Admitting: Emergency Medicine

## 2024-03-18 ENCOUNTER — Encounter (HOSPITAL_BASED_OUTPATIENT_CLINIC_OR_DEPARTMENT_OTHER): Payer: Self-pay | Admitting: Emergency Medicine

## 2024-03-18 DIAGNOSIS — Z79899 Other long term (current) drug therapy: Secondary | ICD-10-CM | POA: Diagnosis not present

## 2024-03-18 DIAGNOSIS — M4187 Other forms of scoliosis, lumbosacral region: Secondary | ICD-10-CM | POA: Diagnosis not present

## 2024-03-18 DIAGNOSIS — M791 Myalgia, unspecified site: Secondary | ICD-10-CM | POA: Insufficient documentation

## 2024-03-18 DIAGNOSIS — M549 Dorsalgia, unspecified: Secondary | ICD-10-CM | POA: Diagnosis not present

## 2024-03-18 DIAGNOSIS — M47816 Spondylosis without myelopathy or radiculopathy, lumbar region: Secondary | ICD-10-CM | POA: Diagnosis not present

## 2024-03-18 DIAGNOSIS — M5459 Other low back pain: Secondary | ICD-10-CM | POA: Diagnosis not present

## 2024-03-18 DIAGNOSIS — M7918 Myalgia, other site: Secondary | ICD-10-CM

## 2024-03-18 DIAGNOSIS — M545 Low back pain, unspecified: Secondary | ICD-10-CM | POA: Diagnosis not present

## 2024-03-18 MED ORDER — LIDOCAINE 5 % EX PTCH: 1.0000 | MEDICATED_PATCH | CUTANEOUS | 0 refills | Status: AC

## 2024-03-18 MED ORDER — ACETAMINOPHEN 325 MG PO TABS: 650.0000 mg | ORAL_TABLET | Freq: Four times a day (QID) | ORAL | 0 refills | Status: AC | PRN

## 2024-03-18 MED ORDER — ACETAMINOPHEN 325 MG PO TABS
650.0000 mg | ORAL_TABLET | Freq: Once | ORAL | Status: AC
  Administered 2024-03-18: 650 mg via ORAL
  Filled 2024-03-18: qty 2

## 2024-03-18 MED ORDER — LIDOCAINE 5 % EX PTCH
1.0000 | MEDICATED_PATCH | CUTANEOUS | Status: DC
  Administered 2024-03-18: 1 via TRANSDERMAL
  Filled 2024-03-18: qty 1

## 2024-03-18 MED ORDER — METHOCARBAMOL 500 MG PO TABS: 500.0000 mg | ORAL_TABLET | Freq: Two times a day (BID) | ORAL | 0 refills | Status: AC

## 2024-03-18 NOTE — ED Notes (Signed)
 Husband brought in via wheelchair from lobby; then taken to restroom in wheelchair.

## 2024-03-18 NOTE — ED Provider Notes (Signed)
 [place] [facility] DEPARTMENT AT [facility] Provider Note   CSN: [id number] Arrival date & time: 03/18/24  1107     History  Chief Complaint  Patient presents with   Back Pain    Barbara Bailey is a 83 y.o. female.  Patient is an 83 year old female presenting for low back pain.  Patient admits to right sided low back pain that developed over the last 2 days.  She follows with her PCP who gave her a prescription for Robaxin and prednisone.  She denies any falls or eliciting injuries.  She denies any midline pain.  She denies any sensation deficits or motor deficits in the lower extremities.  She denies radiation down the legs.  She denies bowel or urinary continence.  She denies lower abdominal pain, dysuria, increased frequency, urgency.  The history is provided by the patient. No language interpreter was used.  Back Pain Associated symptoms: no abdominal pain, no chest pain, no dysuria and no fever        Home Medications Prior to Admission medications   Medication Sig Start Date End Date Taking? Authorizing Provider  acetaminophen (TYLENOL) 325 MG tablet Take 2 tablets (650 mg total) by mouth every 6 (six) hours as needed for moderate pain (pain score 4-6). 03/18/24  Yes Edwin Dada P, DO  lidocaine (LIDODERM) 5 % Place 1 patch onto the skin daily. Remove & Discard patch within 12 hours or as directed by MD 03/18/24  Yes Edwin Dada P, DO  methocarbamol (ROBAXIN) 500 MG tablet Take 1 tablet (500 mg total) by mouth 2 (two) times daily. 03/18/24  Yes Edwin Dada P, DO  predniSONE (DELTASONE) 20 MG tablet Take 20 mg by mouth daily. 03/11/24  Yes [provider]  cholecalciferol (VITAMIN D) 1000 units tablet Take 1,000 Units by mouth daily.    [provider]  COVID-19 mRNA bivalent vaccine, Pfizer, (PFIZER COVID-19 VAC BIVALENT) injection Inject into the muscle. 09/10/21   Judyann Munson, MD  COVID-19 mRNA bivalent vaccine, Pfizer, (PFIZER COVID-19 VAC  BIVALENT) injection Inject into the muscle. 04/23/22   Judyann Munson, MD  COVID-19 mRNA vaccine [phone] (COMIRNATY) syringe Inject into the muscle. 10/11/22   Judyann Munson, MD  COVID-19 mRNA vaccine, Pfizer, (COMIRNATY) syringe Inject into the muscle. 10/24/23   Judyann Munson, MD  dicyclomine (BENTYL) 10 MG capsule Take 10 mg by mouth 3 (three) times daily as needed (*1-3 times daily*).    [provider]  Multiple Vitamins-Minerals (MULTIVITAMIN ADULT) TABS Take 1 tablet by mouth daily.    [provider]  olmesartan-hydrochlorothiazide (BENICAR HCT) 20-12.5 MG tablet Take 1 tablet by mouth daily.    [provider]  Omega-3 Fatty Acids (FISH OIL) 1000 MG CAPS Take 1 capsule by mouth daily.    [provider]      Allergies    Doxycycline hyclate and Omeprazole    Review of Systems   Review of Systems  Constitutional:  Negative for chills and fever.  HENT:  Negative for ear pain and sore throat.   Eyes:  Negative for pain and visual disturbance.  Respiratory:  Negative for cough and shortness of breath.   Cardiovascular:  Negative for chest pain and palpitations.  Gastrointestinal:  Negative for abdominal pain and vomiting.  Genitourinary:  Negative for dysuria and hematuria.  Musculoskeletal:  Positive for back pain. Negative for arthralgias.  Skin:  Negative for color change and rash.  Neurological:  Negative for seizures and syncope.  All other systems  reviewed and are negative.   Physical Exam Updated Vital Signs BP (!) 171/87 (BP Location: Right Arm)   Pulse 78   Temp 98.3 F (36.8 C) (Oral)   Ht 5\' 6"  (1.676 m)   Wt 56.2 kg   SpO2 100%   BMI 20.00 kg/m  Physical Exam Vitals and nursing note reviewed.  Constitutional:      General: She is not in acute distress.    Appearance: She is well-developed.  HENT:     Head: Normocephalic and atraumatic.  Eyes:     Conjunctiva/sclera: Conjunctivae normal.  Cardiovascular:     Rate  and Rhythm: Normal rate and regular rhythm.     Heart sounds: No murmur heard. Pulmonary:     Effort: Pulmonary effort is normal. No respiratory distress.     Breath sounds: Normal breath sounds.  Abdominal:     Palpations: Abdomen is soft.     Tenderness: There is no abdominal tenderness.  Musculoskeletal:        General: No swelling.     Cervical back: Normal and neck supple.     Thoracic back: Normal.     Lumbar back: Tenderness present. No bony tenderness.       Back:  Skin:    General: Skin is warm and dry.     Capillary Refill: Capillary refill takes less than 2 seconds.  Neurological:     Mental Status: She is alert.     Motor: Motor function is intact.     Coordination: Coordination is intact.  Psychiatric:        Mood and Affect: Mood normal.     ED Results / Procedures / Treatments   Labs (all labs ordered are listed, but only abnormal results are displayed) Labs Reviewed  URINALYSIS, ROUTINE W REFLEX MICROSCOPIC    EKG None  Radiology DG Lumbar Spine 2-3 Views Result Date: 03/18/2024 CLINICAL DATA:  Back pain EXAM: LUMBAR SPINE - 2-3 VIEW COMPARISON:  None Available. FINDINGS: Convex leftward scoliosis in the lumbar spine. Mild associated degenerative changes. No fracture or subluxation. Aortoiliac atherosclerosis. No evidence of aneurysm. IMPRESSION: Convex leftward scoliosis and degenerative changes. No acute bony abnormality. Electronically Signed   By: Charlett Nose M.D.   On: 03/18/2024 14:55    Procedures Procedures    Medications Ordered in ED Medications  lidocaine (LIDODERM) 5 % 1 patch (has no administration in time range)  acetaminophen (TYLENOL) tablet 650 mg (has no administration in time range)    ED Course/ Medical Decision Making/ A&P                                 Medical Decision Making Amount and/or Complexity of Data Reviewed Labs: ordered. Radiology: ordered.   37:73 PM 83 year old female presenting for low back pain.    Aox3, no acute distress, afebrile, with stable vitals. No neurovascular deficits. No signs of cauda equina syndrome, spinal epidural abscess, transverse myelitis, hx of trauma, or concerning hx/physical for cancerous etiology.  X-ray stable.  Patient recommended for urinalysis but I am not able to provide.  For close follow-up with PCP if symptoms not improved for urine check.  Exam her pain is reproducible on the right lumbar paraspinal muscles.  Symptoms thought to be musculoskeletal in nature.  Recommended for Lidoderm patch, heat, Tylenol.  Patient in no distress and overall condition improved here in the ED. Detailed discussions were had with the patient regarding  current findings, and need for close f/u with PCP or on call doctor. The patient has been instructed to return immediately if the symptoms worsen in any way for re-evaluation. Patient verbalized understanding and is in agreement with current care plan. All questions answered prior to discharge.          Final Clinical Impression(s) / ED Diagnoses Final diagnoses:  Acute right-sided low back pain, unspecified whether sciatica present  Musculoskeletal pain    Rx / DC Orders ED Discharge Orders          Ordered    lidocaine (LIDODERM) 5 %  Every 24 hours        03/18/24 1524    acetaminophen (TYLENOL) 325 MG tablet  Every 6 hours PRN        03/18/24 1524    methocarbamol (ROBAXIN) 500 MG tablet  2 times daily        03/18/24 1524              Franne Forts, DO 03/18/24 1524

## 2024-03-18 NOTE — ED Triage Notes (Signed)
 Pt via pov from home with back pain x 2 days. Pt denies urinary symptoms. Pt reports this has happened in the past, but not for a long time. Pt thinks she has arthritis in her back; has been taking tylenol for the pain. Pt alert & oriented, nad noted.

## 2024-04-28 DIAGNOSIS — L821 Other seborrheic keratosis: Secondary | ICD-10-CM | POA: Diagnosis not present

## 2024-04-28 DIAGNOSIS — L570 Actinic keratosis: Secondary | ICD-10-CM | POA: Diagnosis not present

## 2024-04-28 DIAGNOSIS — Z85828 Personal history of other malignant neoplasm of skin: Secondary | ICD-10-CM | POA: Diagnosis not present

## 2024-04-28 DIAGNOSIS — Z85820 Personal history of malignant melanoma of skin: Secondary | ICD-10-CM | POA: Diagnosis not present

## 2024-04-28 DIAGNOSIS — L905 Scar conditions and fibrosis of skin: Secondary | ICD-10-CM | POA: Diagnosis not present

## 2024-04-28 DIAGNOSIS — D225 Melanocytic nevi of trunk: Secondary | ICD-10-CM | POA: Diagnosis not present

## 2024-04-28 DIAGNOSIS — D2272 Melanocytic nevi of left lower limb, including hip: Secondary | ICD-10-CM | POA: Diagnosis not present

## 2024-07-26 DIAGNOSIS — R7301 Impaired fasting glucose: Secondary | ICD-10-CM | POA: Diagnosis not present

## 2024-07-26 DIAGNOSIS — E785 Hyperlipidemia, unspecified: Secondary | ICD-10-CM | POA: Diagnosis not present

## 2024-07-26 DIAGNOSIS — E7849 Other hyperlipidemia: Secondary | ICD-10-CM | POA: Diagnosis not present

## 2024-07-26 DIAGNOSIS — Z79899 Other long term (current) drug therapy: Secondary | ICD-10-CM | POA: Diagnosis not present

## 2024-07-26 DIAGNOSIS — I10 Essential (primary) hypertension: Secondary | ICD-10-CM | POA: Diagnosis not present

## 2024-07-30 DIAGNOSIS — Z1339 Encounter for screening examination for other mental health and behavioral disorders: Secondary | ICD-10-CM | POA: Diagnosis not present

## 2024-07-30 DIAGNOSIS — M810 Age-related osteoporosis without current pathological fracture: Secondary | ICD-10-CM | POA: Diagnosis not present

## 2024-07-30 DIAGNOSIS — G930 Cerebral cysts: Secondary | ICD-10-CM | POA: Diagnosis not present

## 2024-07-30 DIAGNOSIS — Z1331 Encounter for screening for depression: Secondary | ICD-10-CM | POA: Diagnosis not present

## 2024-07-30 DIAGNOSIS — R7301 Impaired fasting glucose: Secondary | ICD-10-CM | POA: Diagnosis not present

## 2024-07-30 DIAGNOSIS — Z79899 Other long term (current) drug therapy: Secondary | ICD-10-CM | POA: Diagnosis not present

## 2024-07-30 DIAGNOSIS — I10 Essential (primary) hypertension: Secondary | ICD-10-CM | POA: Diagnosis not present

## 2024-07-30 DIAGNOSIS — C4491 Basal cell carcinoma of skin, unspecified: Secondary | ICD-10-CM | POA: Diagnosis not present

## 2024-07-30 DIAGNOSIS — E785 Hyperlipidemia, unspecified: Secondary | ICD-10-CM | POA: Diagnosis not present

## 2024-07-30 DIAGNOSIS — Z0000 Encounter for general adult medical examination without abnormal findings: Secondary | ICD-10-CM | POA: Diagnosis not present

## 2024-07-30 DIAGNOSIS — M797 Fibromyalgia: Secondary | ICD-10-CM | POA: Diagnosis not present

## 2024-07-30 DIAGNOSIS — H6980 Other specified disorders of Eustachian tube, unspecified ear: Secondary | ICD-10-CM | POA: Diagnosis not present

## 2024-07-30 DIAGNOSIS — R82998 Other abnormal findings in urine: Secondary | ICD-10-CM | POA: Diagnosis not present

## 2024-08-09 DIAGNOSIS — Z85828 Personal history of other malignant neoplasm of skin: Secondary | ICD-10-CM | POA: Diagnosis not present

## 2024-08-09 DIAGNOSIS — L821 Other seborrheic keratosis: Secondary | ICD-10-CM | POA: Diagnosis not present

## 2024-08-09 DIAGNOSIS — L820 Inflamed seborrheic keratosis: Secondary | ICD-10-CM | POA: Diagnosis not present

## 2024-10-08 ENCOUNTER — Other Ambulatory Visit (HOSPITAL_BASED_OUTPATIENT_CLINIC_OR_DEPARTMENT_OTHER): Payer: Self-pay

## 2024-10-08 DIAGNOSIS — Z23 Encounter for immunization: Secondary | ICD-10-CM | POA: Diagnosis not present

## 2024-10-08 MED ORDER — COMIRNATY 30 MCG/0.3ML IM SUSY
0.3000 mL | PREFILLED_SYRINGE | Freq: Once | INTRAMUSCULAR | 0 refills | Status: AC
  Filled 2024-10-08: qty 0.3, 1d supply, fill #0

## 2024-12-02 DIAGNOSIS — R0600 Dyspnea, unspecified: Secondary | ICD-10-CM | POA: Diagnosis not present

## 2024-12-03 ENCOUNTER — Other Ambulatory Visit (HOSPITAL_COMMUNITY): Payer: Self-pay | Admitting: Nurse Practitioner

## 2024-12-03 DIAGNOSIS — R600 Localized edema: Secondary | ICD-10-CM

## 2024-12-09 ENCOUNTER — Telehealth (HOSPITAL_COMMUNITY): Payer: Self-pay | Admitting: Nurse Practitioner

## 2024-12-09 NOTE — Telephone Encounter (Signed)
 Just an FYI. We have made several attempts to contact this patient  to schedule or reschedule their echocardiogram. We will be removing the patient from the echo WQ.   12/09/24 ORDER REMOVED FROM ACTIVE ECHO WQ DUE TO NO RESPONSE FROM PATIENT.  12/09/24 [facility] x 3 FINAL ATTEMPT 12/07/24 [facility] @ 2:03/[facility]W  12/03/24 Called and NVM set up @ 1:13/LBW  PRIOR AUTH NOT REQUIRED (ONBASE)        Thank you

## 2024-12-30 ENCOUNTER — Other Ambulatory Visit (HOSPITAL_BASED_OUTPATIENT_CLINIC_OR_DEPARTMENT_OTHER): Payer: Self-pay

## 2024-12-30 ENCOUNTER — Other Ambulatory Visit: Payer: Self-pay

## 2024-12-31 ENCOUNTER — Other Ambulatory Visit: Payer: Self-pay
